# Patient Record
Sex: Male | Born: 1970 | Race: Black or African American | Hispanic: No | Marital: Single | Smoking: Never smoker
Health system: Southern US, Community
[De-identification: ages and names within clinical notes are randomized; demographics above are authoritative.]

---

## 2020-02-23 ENCOUNTER — Emergency Department (HOSPITAL_COMMUNITY): Payer: Self-pay

## 2020-02-23 ENCOUNTER — Other Ambulatory Visit: Payer: Self-pay

## 2020-02-23 ENCOUNTER — Emergency Department (HOSPITAL_COMMUNITY)
Admission: EM | Admit: 2020-02-23 | Discharge: 2020-02-23 | Disposition: A | Discharge status: Home / Self Care | Payer: Self-pay | Attending: Emergency Medicine | Admitting: Emergency Medicine

## 2020-02-23 ENCOUNTER — Encounter (HOSPITAL_COMMUNITY): Payer: Self-pay | Admitting: Emergency Medicine

## 2020-02-23 DIAGNOSIS — M79641 Pain in right hand: Secondary | ICD-10-CM | POA: Insufficient documentation

## 2020-02-23 MED ORDER — OXYCODONE-ACETAMINOPHEN 5-325 MG PO TABS
1.0000 | ORAL_TABLET | Freq: Once | ORAL | Status: AC
  Administered 2020-02-23: 1 via ORAL
  Filled 2020-02-23: qty 1

## 2020-02-23 MED ORDER — NAPROXEN 500 MG PO TABS
500.0000 mg | ORAL_TABLET | Freq: Two times a day (BID) | ORAL | 0 refills | Status: AC
Start: 2020-02-23 — End: 2020-03-01

## 2020-02-23 NOTE — ED Provider Notes (Signed)
[place] DEPT Provider Note   CSN: [id number] Arrival date & time: 02/23/20  0502     History Chief Complaint  Patient presents with  . Hand Injury    Stephen Fisher is a 49 y.o. right-hand-dominant male presents to emergency department today with chief complaint of progressively worsening right hand pain x4 days.  Patient states he was moving furniture when he lost his grip while carrying a chair him to hit his right hand on the wall.  The next day he noticed swelling and pain in his hand.  He states the pain has been constant.  The pain is an aching sensation.  Pain is located on the top of his hand and does not radiate.  He has not taken medications for symptoms prior to arrival.  He denies any fever, chills, open wound, numbness, tingling, decrease sensation in the right hand.   History reviewed. No pertinent past medical history.  There are no problems to display for this patient.   History reviewed. No pertinent surgical history.     No family history on file.  Social History   Tobacco Use  . Smoking status: Never Smoker  . Smokeless tobacco: Never Used  Substance Use Topics  . Alcohol use: Yes    Comment: occassional   . Drug use: Not Currently    Home Medications Prior to Admission medications   Medication Sig Start Date End Date Taking? Authorizing Provider  naproxen (NAPROSYN) 500 MG tablet Take 1 tablet (500 mg total) by mouth 2 (two) times daily for 7 days. 02/23/20 03/01/20  Carola Viramontes, Harley Hallmark, PA-C    Allergies    Patient has no known allergies.  Review of Systems   Review of Systems All other systems are reviewed and are negative for acute change except as noted in the HPI.  Physical Exam Updated Vital Signs BP (!) 150/103 (BP Location: Left Arm)   Pulse 68   Temp 98.1 F (36.7 C) (Oral)   Resp 17   Ht 5\' 10"  (1.778 m)   Wt 90.7 kg   SpO2 100%   BMI 28.70 kg/m   Physical Exam Vitals and nursing note  reviewed.  Constitutional:      Appearance: He is well-developed. He is not ill-appearing or toxic-appearing.  HENT:     Head: Normocephalic and atraumatic.     Nose: Nose normal.  Eyes:     General: No scleral icterus.       Right eye: No discharge.        Left eye: No discharge.     Conjunctiva/sclera: Conjunctivae normal.  Neck:     Vascular: No JVD.  Cardiovascular:     Rate and Rhythm: Normal rate and regular rhythm.     Pulses: Normal pulses.     Heart sounds: Normal heart sounds.  Pulmonary:     Effort: Pulmonary effort is normal.     Breath sounds: Normal breath sounds.  Abdominal:     General: There is no distension.  Musculoskeletal:        General: Normal range of motion.     Right hand: Swelling present. No deformity or lacerations. Normal range of motion. Normal strength. Normal sensation. Normal capillary refill.     Cervical back: Normal range of motion.     Comments: Right hand with  tenderness to palpation of third and fourth metacarpals.  + swelling. There is no joint effusion noted. Full ROM without pain.  No erythema or warmth overlaying  the joint. There is no anatomic snuff box tenderness. Normal sensation and motor function in the median, ulnar, and radial nerve distributions. 2+ radial pulse. Tactile temperature is equal in bilateral upper extremities.   Skin:    General: Skin is warm and dry.  Neurological:     Mental Status: He is oriented to person, place, and time.     GCS: GCS eye subscore is 4. GCS verbal subscore is 5. GCS motor subscore is 6.     Comments: Fluent speech, no facial droop.  Psychiatric:        Behavior: Behavior normal.     ED Results / Procedures / Treatments   Labs (all labs ordered are listed, but only abnormal results are displayed) Labs Reviewed - No data to display  EKG None  Radiology DG Hand Complete Right  Result Date: 02/23/2020 CLINICAL DATA:  Injured hand 02/20/2020. Dropped a piece of furniture on the hand.  EXAM: RIGHT HAND - COMPLETE 3+ VIEW COMPARISON:  None. FINDINGS: Moderate dorsal soft tissue swelling is noted. The joint spaces are maintained. No acute fracture is identified. IMPRESSION: 1. No acute bony findings. 2. Dorsal soft tissue swelling. Electronically Signed   By: Rudie Meyer M.D.   On: 02/23/2020 06:32    Procedures Procedures (including critical care time)  Medications Ordered in ED Medications  oxyCODONE-acetaminophen (PERCOCET/ROXICET) 5-325 MG per tablet 1 tablet (1 tablet Oral Given 02/23/20 0608)    ED Course  I have reviewed the triage vital signs and the nursing notes.  Pertinent labs & imaging results that were available during my care of the patient were reviewed by me and considered in my medical decision making (see chart for details).    MDM Rules/Calculators/A&P                      Patient presents to the ED with complaints of pain to the right hand injury x 4 days prior. Exam without obvious deformity or open wounds.  He does have swelling noted to dorsum of right hand over third and fourth metacarpals.  No anatomic snuffbox tenderness.  Compartments are soft.  ROM intact.  NVI distally. Xray negative for fracture/dislocation. Ace wrap provided for compression. Patient given dose of pain medicine in the ED discharged with prescription for naproxen for inflammation.. I discussed results, treatment plan, need for follow-up, and return precautions with the patient. Provided opportunity for questions, patient confirmed understanding and are in agreement with plan.    Portions of this note were generated with Scientist, clinical (histocompatibility and immunogenetics). Dictation errors may occur despite best attempts at proofreading.   Final Clinical Impression(s) / ED Diagnoses Final diagnoses:  Right hand pain    Rx / DC Orders ED Discharge Orders         Ordered    naproxen (NAPROSYN) 500 MG tablet  2 times daily     02/23/20 0649           Takahiro Godinho, Caroleen Hamman, PA-C 02/23/20  0715    Glynn Octave, MD 02/23/20 [phone]

## 2020-02-23 NOTE — ED Notes (Signed)
Pt stating he was at home moving furniture around and hit his hand while moving furniture.

## 2020-02-23 NOTE — Discharge Instructions (Signed)
You have been seen today for hand pain. Please read and follow all provided instructions. Return to the emergency room for worsening condition or new concerning symptoms.    Xray did not show any broken bones.  1. Medications:  Prescription sent to your pharmacy for Naproxen. This is an antiinflammatory medication. Take with food as this can cause upset stomach. Do not take any additional ibuprofen, Aleve, Motrin as these medications are all similar. -Also recommend you take Tylenol for pain.  Continue usual home medications Take medications as prescribed. Please review all of the medicines and only take them if you do not have an allergy to them.   2. Treatment: Use the Ace wrap on your hand as this will help with the swelling.  3. Follow Up:  Please follow up with primary care provider by scheduling an appointment as soon as possible for a visit  If you do not have a primary care physician, contact [facility] at [phone] for referral   It is also a possibility that you have an allergic reaction to any of the medicines that you have been prescribed - Everybody reacts differently to medications and while MOST people have no trouble with most medicines, you may have a reaction such as nausea, vomiting, rash, swelling, shortness of breath. If this is the case, please stop taking the medicine immediately and contact your physician.  ?

## 2020-02-23 NOTE — ED Triage Notes (Signed)
Patient states Monday he dropped a piece of furniture on his hand. Hand noted to be very red and swollen. Patient with feeling and movement in fingers.

## 2020-02-28 ENCOUNTER — Encounter (HOSPITAL_COMMUNITY): Payer: Self-pay

## 2020-02-28 ENCOUNTER — Emergency Department (HOSPITAL_COMMUNITY): Payer: Self-pay

## 2020-02-28 ENCOUNTER — Emergency Department (HOSPITAL_COMMUNITY)
Admission: EM | Admit: 2020-02-28 | Discharge: 2020-02-29 | Disposition: A | Discharge status: Home / Self Care | Payer: Self-pay | Attending: Emergency Medicine | Admitting: Emergency Medicine

## 2020-02-28 ENCOUNTER — Other Ambulatory Visit: Payer: Self-pay

## 2020-02-28 DIAGNOSIS — R52 Pain, unspecified: Secondary | ICD-10-CM

## 2020-02-28 DIAGNOSIS — M25512 Pain in left shoulder: Secondary | ICD-10-CM | POA: Insufficient documentation

## 2020-02-28 DIAGNOSIS — Z5321 Procedure and treatment not carried out due to patient leaving prior to being seen by health care provider: Secondary | ICD-10-CM | POA: Insufficient documentation
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
## 2020-02-29 NOTE — ED Notes (Signed)
Pt sts they were leaving waiting room. Eloped prior to speaking with nurse. No distress noted.

## 2020-05-16 ENCOUNTER — Inpatient Hospital Stay (HOSPITAL_COMMUNITY): Payer: Self-pay

## 2020-05-16 ENCOUNTER — Inpatient Hospital Stay (HOSPITAL_COMMUNITY)
Admission: EM | Admit: 2020-05-16 | Discharge: 2020-06-07 | DRG: 870 | Disposition: E | Payer: Self-pay | Attending: Pulmonary Disease | Admitting: Pulmonary Disease

## 2020-05-16 ENCOUNTER — Emergency Department (HOSPITAL_COMMUNITY): Payer: Self-pay

## 2020-05-16 DIAGNOSIS — R451 Restlessness and agitation: Secondary | ICD-10-CM | POA: Diagnosis present

## 2020-05-16 DIAGNOSIS — J1282 Pneumonia due to coronavirus disease 2019: Secondary | ICD-10-CM | POA: Diagnosis present

## 2020-05-16 DIAGNOSIS — J9690 Respiratory failure, unspecified, unspecified whether with hypoxia or hypercapnia: Secondary | ICD-10-CM

## 2020-05-16 DIAGNOSIS — E7220 Disorder of urea cycle metabolism, unspecified: Secondary | ICD-10-CM

## 2020-05-16 DIAGNOSIS — F10129 Alcohol abuse with intoxication, unspecified: Secondary | ICD-10-CM | POA: Diagnosis present

## 2020-05-16 DIAGNOSIS — E872 Acidosis, unspecified: Secondary | ICD-10-CM

## 2020-05-16 DIAGNOSIS — I472 Ventricular tachycardia: Secondary | ICD-10-CM | POA: Diagnosis present

## 2020-05-16 DIAGNOSIS — U071 COVID-19: Secondary | ICD-10-CM | POA: Diagnosis present

## 2020-05-16 DIAGNOSIS — I469 Cardiac arrest, cause unspecified: Secondary | ICD-10-CM | POA: Diagnosis present

## 2020-05-16 DIAGNOSIS — E1110 Type 2 diabetes mellitus with ketoacidosis without coma: Secondary | ICD-10-CM | POA: Diagnosis present

## 2020-05-16 DIAGNOSIS — Y906 Blood alcohol level of 120-199 mg/100 ml: Secondary | ICD-10-CM | POA: Diagnosis present

## 2020-05-16 DIAGNOSIS — R6521 Severe sepsis with septic shock: Secondary | ICD-10-CM | POA: Diagnosis present

## 2020-05-16 DIAGNOSIS — M6282 Rhabdomyolysis: Secondary | ICD-10-CM | POA: Diagnosis not present

## 2020-05-16 DIAGNOSIS — R34 Anuria and oliguria: Secondary | ICD-10-CM | POA: Diagnosis not present

## 2020-05-16 DIAGNOSIS — A4189 Other specified sepsis: Principal | ICD-10-CM | POA: Diagnosis present

## 2020-05-16 DIAGNOSIS — Z66 Do not resuscitate: Secondary | ICD-10-CM | POA: Diagnosis not present

## 2020-05-16 DIAGNOSIS — R579 Shock, unspecified: Secondary | ICD-10-CM

## 2020-05-16 DIAGNOSIS — E669 Obesity, unspecified: Secondary | ICD-10-CM | POA: Diagnosis present

## 2020-05-16 DIAGNOSIS — E875 Hyperkalemia: Secondary | ICD-10-CM | POA: Diagnosis present

## 2020-05-16 DIAGNOSIS — I10 Essential (primary) hypertension: Secondary | ICD-10-CM | POA: Diagnosis present

## 2020-05-16 DIAGNOSIS — F419 Anxiety disorder, unspecified: Secondary | ICD-10-CM | POA: Diagnosis present

## 2020-05-16 DIAGNOSIS — J9602 Acute respiratory failure with hypercapnia: Secondary | ICD-10-CM | POA: Diagnosis present

## 2020-05-16 DIAGNOSIS — J690 Pneumonitis due to inhalation of food and vomit: Secondary | ICD-10-CM | POA: Diagnosis present

## 2020-05-16 DIAGNOSIS — F10929 Alcohol use, unspecified with intoxication, unspecified: Secondary | ICD-10-CM

## 2020-05-16 DIAGNOSIS — G931 Anoxic brain damage, not elsewhere classified: Secondary | ICD-10-CM | POA: Diagnosis present

## 2020-05-16 DIAGNOSIS — Z6830 Body mass index (BMI) 30.0-30.9, adult: Secondary | ICD-10-CM

## 2020-05-16 DIAGNOSIS — N179 Acute kidney failure, unspecified: Secondary | ICD-10-CM | POA: Diagnosis present

## 2020-05-16 DIAGNOSIS — D689 Coagulation defect, unspecified: Secondary | ICD-10-CM | POA: Diagnosis not present

## 2020-05-16 DIAGNOSIS — Z978 Presence of other specified devices: Secondary | ICD-10-CM

## 2020-05-16 DIAGNOSIS — I462 Cardiac arrest due to underlying cardiac condition: Secondary | ICD-10-CM | POA: Diagnosis present

## 2020-05-16 DIAGNOSIS — Z9911 Dependence on respirator [ventilator] status: Secondary | ICD-10-CM

## 2020-05-16 DIAGNOSIS — R600 Localized edema: Secondary | ICD-10-CM | POA: Diagnosis not present

## 2020-05-16 DIAGNOSIS — K7200 Acute and subacute hepatic failure without coma: Secondary | ICD-10-CM | POA: Diagnosis present

## 2020-05-16 DIAGNOSIS — E870 Hyperosmolality and hypernatremia: Secondary | ICD-10-CM | POA: Diagnosis present

## 2020-05-16 DIAGNOSIS — I6783 Posterior reversible encephalopathy syndrome: Secondary | ICD-10-CM | POA: Diagnosis present

## 2020-05-16 DIAGNOSIS — F1410 Cocaine abuse, uncomplicated: Secondary | ICD-10-CM | POA: Diagnosis present

## 2020-05-16 DIAGNOSIS — Z515 Encounter for palliative care: Secondary | ICD-10-CM | POA: Diagnosis not present

## 2020-05-16 DIAGNOSIS — J9600 Acute respiratory failure, unspecified whether with hypoxia or hypercapnia: Secondary | ICD-10-CM

## 2020-05-16 DIAGNOSIS — Z7189 Other specified counseling: Secondary | ICD-10-CM

## 2020-05-16 DIAGNOSIS — J9601 Acute respiratory failure with hypoxia: Secondary | ICD-10-CM | POA: Diagnosis present

## 2020-05-16 DIAGNOSIS — R339 Retention of urine, unspecified: Secondary | ICD-10-CM | POA: Diagnosis present

## 2020-05-16 DIAGNOSIS — G253 Myoclonus: Secondary | ICD-10-CM | POA: Diagnosis not present

## 2020-05-16 LAB — CBG MONITORING, ED: Glucose-Capillary: 76 mg/dL (ref 70–99)

## 2020-05-16 LAB — I-STAT ARTERIAL BLOOD GAS, ED
Acid-base deficit: 18 mmol/L — ABNORMAL HIGH (ref 0.0–2.0)
Acid-base deficit: 22 mmol/L — ABNORMAL HIGH (ref 0.0–2.0)
Bicarbonate: 11.9 mmol/L — ABNORMAL LOW (ref 20.0–28.0)
Bicarbonate: 12.4 mmol/L — ABNORMAL LOW (ref 20.0–28.0)
Calcium, Ion: 0.82 mmol/L — CL (ref 1.15–1.40)
Calcium, Ion: 0.99 mmol/L — ABNORMAL LOW (ref 1.15–1.40)
HCT: 36 % — ABNORMAL LOW (ref 39.0–52.0)
HCT: 43 % (ref 39.0–52.0)
Hemoglobin: 12.2 g/dL — ABNORMAL LOW (ref 13.0–17.0)
Hemoglobin: 14.6 g/dL (ref 13.0–17.0)
O2 Saturation: 100 %
O2 Saturation: 89 %
Patient temperature: 97.3
Potassium: 5.4 mmol/L — ABNORMAL HIGH (ref 3.5–5.1)
Potassium: 7.5 mmol/L (ref 3.5–5.1)
Sodium: 141 mmol/L (ref 135–145)
Sodium: 142 mmol/L (ref 135–145)
TCO2: 13 mmol/L — ABNORMAL LOW (ref 22–32)
TCO2: 15 mmol/L — ABNORMAL LOW (ref 22–32)
pCO2 arterial: 43.1 mmHg (ref 32.0–48.0)
pCO2 arterial: 74 mmHg (ref 32.0–48.0)
pH, Arterial: 6.831 — CL (ref 7.350–7.450)
pH, Arterial: 7.046 — CL (ref 7.350–7.450)
pO2, Arterial: 420 mmHg — ABNORMAL HIGH (ref 83.0–108.0)
pO2, Arterial: 78 mmHg — ABNORMAL LOW (ref 83.0–108.0)

## 2020-05-16 LAB — COMPREHENSIVE METABOLIC PANEL
ALT: 5405 U/L — ABNORMAL HIGH (ref 0–44)
AST: 5312 U/L — ABNORMAL HIGH (ref 15–41)
Albumin: 2.7 g/dL — ABNORMAL LOW (ref 3.5–5.0)
Alkaline Phosphatase: 76 U/L (ref 38–126)
Anion gap: 25 — ABNORMAL HIGH (ref 5–15)
BUN: 14 mg/dL (ref 6–20)
CO2: 17 mmol/L — ABNORMAL LOW (ref 22–32)
Calcium: 8.9 mg/dL (ref 8.9–10.3)
Chloride: 107 mmol/L (ref 98–111)
Creatinine, Ser: 3.11 mg/dL — ABNORMAL HIGH (ref 0.61–1.24)
GFR calc Af Amer: 26 mL/min — ABNORMAL LOW (ref >60)
GFR calc non Af Amer: 23 mL/min — ABNORMAL LOW (ref >60)
Glucose, Bld: 88 mg/dL (ref 70–99)
Potassium: 5.6 mmol/L — ABNORMAL HIGH (ref 3.5–5.1)
Sodium: 149 mmol/L — ABNORMAL HIGH (ref 135–145)
Total Bilirubin: 0.9 mg/dL (ref 0.3–1.2)
Total Protein: 4.8 g/dL — ABNORMAL LOW (ref 6.5–8.1)

## 2020-05-16 LAB — D-DIMER, QUANTITATIVE: D-Dimer, Quant: >20 ug/mL-FEU — ABNORMAL HIGH (ref 0.00–0.50)

## 2020-05-16 LAB — RAPID URINE DRUG SCREEN, HOSP PERFORMED
Amphetamines: NOT DETECTED
Barbiturates: NOT DETECTED
Benzodiazepines: NOT DETECTED
Cocaine: POSITIVE — AB
Opiates: NOT DETECTED
Tetrahydrocannabinol: NOT DETECTED

## 2020-05-16 LAB — PROTIME-INR
INR: 1.9 — ABNORMAL HIGH (ref 0.8–1.2)
Prothrombin Time: 21.1 seconds — ABNORMAL HIGH (ref 11.4–15.2)

## 2020-05-16 LAB — URINALYSIS, ROUTINE W REFLEX MICROSCOPIC
Bilirubin Urine: NEGATIVE
Glucose, UA: NEGATIVE mg/dL
Ketones, ur: NEGATIVE mg/dL
Leukocytes,Ua: NEGATIVE
Nitrite: NEGATIVE
Protein, ur: 30 mg/dL — AB
Specific Gravity, Urine: 1.016 (ref 1.005–1.030)
pH: 6 (ref 5.0–8.0)

## 2020-05-16 LAB — SARS CORONAVIRUS 2 BY RT PCR (HOSPITAL ORDER, PERFORMED IN ~~LOC~~ HOSPITAL LAB): SARS Coronavirus 2: POSITIVE — AB

## 2020-05-16 LAB — I-STAT CHEM 8, ED
BUN: 21 mg/dL — ABNORMAL HIGH (ref 6–20)
Calcium, Ion: 1.03 mmol/L — ABNORMAL LOW (ref 1.15–1.40)
Chloride: 110 mmol/L (ref 98–111)
Creatinine, Ser: 3 mg/dL — ABNORMAL HIGH (ref 0.61–1.24)
Glucose, Bld: 76 mg/dL (ref 70–99)
HCT: 38 % — ABNORMAL LOW (ref 39.0–52.0)
Hemoglobin: 12.9 g/dL — ABNORMAL LOW (ref 13.0–17.0)
Potassium: 5.5 mmol/L — ABNORMAL HIGH (ref 3.5–5.1)
Sodium: 144 mmol/L (ref 135–145)
TCO2: 17 mmol/L — ABNORMAL LOW (ref 22–32)

## 2020-05-16 LAB — SALICYLATE LEVEL: Salicylate Lvl: <7 mg/dL — ABNORMAL LOW (ref 7.0–30.0)

## 2020-05-16 LAB — LACTIC ACID, PLASMA
Lactic Acid, Venous: >11 mmol/L (ref 0.5–1.9)
Lactic Acid, Venous: >11 mmol/L (ref 0.5–1.9)

## 2020-05-16 LAB — TROPONIN I (HIGH SENSITIVITY): Troponin I (High Sensitivity): 953 ng/L (ref <18)

## 2020-05-16 LAB — ACETAMINOPHEN LEVEL: Acetaminophen (Tylenol), Serum: <10 ug/mL — ABNORMAL LOW (ref 10–30)

## 2020-05-16 LAB — BRAIN NATRIURETIC PEPTIDE: B Natriuretic Peptide: 55.1 pg/mL (ref 0.0–100.0)

## 2020-05-16 LAB — AMMONIA: Ammonia: 159 umol/L — ABNORMAL HIGH (ref 9–35)

## 2020-05-16 LAB — ETHANOL: Alcohol, Ethyl (B): 123 mg/dL — ABNORMAL HIGH (ref <10)

## 2020-05-16 MED ORDER — SODIUM CHLORIDE 0.9 % IV SOLN
INTRAVENOUS | Status: AC | PRN
  Administered 2020-05-16: 1000 mL via INTRAVENOUS

## 2020-05-16 MED ORDER — HEPARIN SODIUM (PORCINE) 5000 UNIT/ML IJ SOLN
5000.0000 [IU] | Freq: Three times a day (TID) | INTRAMUSCULAR | Status: DC
  Administered 2020-05-16 – 2020-05-17 (×2): 5000 [IU] via SUBCUTANEOUS
  Filled 2020-05-16 (×2): qty 1

## 2020-05-16 MED ORDER — MIDAZOLAM HCL 2 MG/2ML IJ SOLN
2.0000 mg | INTRAMUSCULAR | Status: DC | PRN
  Administered 2020-05-17 – 2020-05-23 (×8): 2 mg via INTRAVENOUS
  Filled 2020-05-16 (×4): qty 2

## 2020-05-16 MED ORDER — SODIUM BICARBONATE 8.4 % IV SOLN
100.0000 meq | Freq: Once | INTRAVENOUS | Status: AC
  Administered 2020-05-16: 100 meq via INTRAVENOUS
  Filled 2020-05-16: qty 50

## 2020-05-16 MED ORDER — PROPOFOL 1000 MG/100ML IV EMUL
0.0000 ug/kg/min | INTRAVENOUS | Status: DC
  Filled 2020-05-16: qty 100

## 2020-05-16 MED ORDER — SODIUM CHLORIDE 0.9 % IV BOLUS
30.0000 mL/kg | Freq: Once | INTRAVENOUS | Status: AC
  Administered 2020-05-16: 2994 mL via INTRAVENOUS

## 2020-05-16 MED ORDER — NOREPINEPHRINE BITARTRATE 1 MG/ML IV SOLN
INTRAVENOUS | Status: AC | PRN
  Administered 2020-05-16: 10 ug/kg/min via INTRAVENOUS

## 2020-05-16 MED ORDER — LACTULOSE 10 GM/15ML PO SOLN
30.0000 g | Freq: Three times a day (TID) | ORAL | Status: DC
  Administered 2020-05-17 – 2020-05-26 (×30): 30 g
  Filled 2020-05-16 (×30): qty 45

## 2020-05-16 MED ORDER — SODIUM BICARBONATE-DEXTROSE 150-5 MEQ/L-% IV SOLN
150.0000 meq | INTRAVENOUS | Status: DC
  Administered 2020-05-17 (×3): 150 meq via INTRAVENOUS
  Filled 2020-05-16 (×7): qty 1000

## 2020-05-16 MED ORDER — CALCIUM GLUCONATE-NACL 1-0.675 GM/50ML-% IV SOLN
1.0000 g | Freq: Once | INTRAVENOUS | Status: AC
  Administered 2020-05-16: 1000 mg via INTRAVENOUS
  Filled 2020-05-16: qty 50

## 2020-05-16 MED ORDER — FAMOTIDINE 40 MG/5ML PO SUSR
20.0000 mg | Freq: Every day | ORAL | Status: DC
  Administered 2020-05-18: 20 mg
  Filled 2020-05-16: qty 2.5

## 2020-05-16 MED ORDER — NOREPINEPHRINE 4 MG/250ML-% IV SOLN
0.0000 ug/min | INTRAVENOUS | Status: DC
  Administered 2020-05-16: 60 ug/min via INTRAVENOUS
  Filled 2020-05-16: qty 250

## 2020-05-16 MED ORDER — ATROPINE SULFATE 1 MG/ML IJ SOLN
INTRAMUSCULAR | Status: AC | PRN
  Administered 2020-05-16: 1 mg via INTRAVENOUS

## 2020-05-16 MED ORDER — NOREPINEPHRINE 16 MG/250ML-% IV SOLN
0.0000 ug/min | INTRAVENOUS | Status: DC
  Administered 2020-05-16: 50 ug/min via INTRAVENOUS
  Administered 2020-05-17: 55 ug/min via INTRAVENOUS
  Administered 2020-05-17: 50 ug/min via INTRAVENOUS
  Administered 2020-05-17: 70 ug/min via INTRAVENOUS
  Administered 2020-05-17: 50 ug/min via INTRAVENOUS
  Administered 2020-05-17: 60 ug/min via INTRAVENOUS
  Administered 2020-05-18: 56 ug/min via INTRAVENOUS
  Administered 2020-05-18: 55 ug/min via INTRAVENOUS
  Administered 2020-05-18: 28 ug/min via INTRAVENOUS
  Administered 2020-05-19: 40 ug/min via INTRAVENOUS
  Administered 2020-05-19: 8 ug/min via INTRAVENOUS
  Filled 2020-05-16 (×12): qty 250

## 2020-05-16 MED ORDER — MIDAZOLAM 50MG/50ML (1MG/ML) PREMIX INFUSION
0.5000 mg/h | INTRAVENOUS | Status: DC
  Administered 2020-05-16: 0.5 mg/h via INTRAVENOUS
  Filled 2020-05-16: qty 50

## 2020-05-16 MED ORDER — SODIUM BICARBONATE 8.4 % IV SOLN
100.0000 meq | Freq: Once | INTRAVENOUS | Status: AC
  Administered 2020-05-16: 100 meq via INTRAVENOUS
  Filled 2020-05-16: qty 100

## 2020-05-16 MED ORDER — SODIUM ZIRCONIUM CYCLOSILICATE 10 G PO PACK
10.0000 g | PACK | Freq: Once | ORAL | Status: AC
  Administered 2020-05-16: 10 g
  Filled 2020-05-16: qty 1

## 2020-05-16 MED ORDER — SODIUM CHLORIDE 0.9 % IV SOLN: INTRAVENOUS | Status: DC | PRN

## 2020-05-16 MED ORDER — VASOPRESSIN 20 UNITS/100 ML INFUSION FOR SHOCK
0.0300 [IU]/min | INTRAVENOUS | Status: DC
  Administered 2020-05-16 – 2020-05-19 (×7): 0.03 [IU]/min via INTRAVENOUS
  Filled 2020-05-16 (×9): qty 100

## 2020-05-16 MED ORDER — MIDAZOLAM HCL 2 MG/2ML IJ SOLN
2.0000 mg | INTRAMUSCULAR | Status: AC | PRN
  Administered 2020-05-17 – 2020-05-22 (×3): 2 mg via INTRAVENOUS
  Filled 2020-05-16 (×2): qty 2

## 2020-05-16 MED ORDER — DOCUSATE SODIUM 50 MG/5ML PO LIQD: 100.0000 mg | Freq: Two times a day (BID) | ORAL | Status: DC

## 2020-05-16 MED ORDER — FENTANYL CITRATE (PF) 100 MCG/2ML IJ SOLN
50.0000 ug | INTRAMUSCULAR | Status: DC | PRN
  Administered 2020-05-20 – 2020-05-22 (×3): 50 ug via INTRAVENOUS
  Administered 2020-05-22: 100 ug via INTRAVENOUS
  Administered 2020-05-22 (×3): 50 ug via INTRAVENOUS
  Administered 2020-05-23: 100 ug via INTRAVENOUS

## 2020-05-16 MED ORDER — FENTANYL CITRATE (PF) 100 MCG/2ML IJ SOLN
50.0000 ug | INTRAMUSCULAR | Status: AC | PRN
  Administered 2020-05-17 – 2020-05-19 (×3): 50 ug via INTRAVENOUS
  Filled 2020-05-16: qty 2

## 2020-05-16 MED ORDER — FAMOTIDINE 40 MG/5ML PO SUSR: 20.0000 mg | Freq: Two times a day (BID) | ORAL | Status: DC

## 2020-05-16 MED ORDER — SODIUM BICARBONATE-DEXTROSE 150-5 MEQ/L-% IV SOLN
150.0000 meq | INTRAVENOUS | Status: DC
  Administered 2020-05-16: 150 meq via INTRAVENOUS
  Filled 2020-05-16: qty 1000

## 2020-05-16 MED ORDER — DEXTROSE 50 % IV SOLN
INTRAVENOUS | Status: AC | PRN
  Administered 2020-05-16: 1 via INTRAVENOUS

## 2020-05-16 MED ORDER — EPINEPHRINE 1 MG/10ML IJ SOSY
PREFILLED_SYRINGE | INTRAMUSCULAR | Status: AC | PRN
  Administered 2020-05-16 (×2): 1 mg via INTRAVENOUS

## 2020-05-16 MED ORDER — POLYETHYLENE GLYCOL 3350 17 G PO PACK
17.0000 g | PACK | Freq: Every day | ORAL | Status: DC
Start: 2020-05-16 — End: 2020-05-16

## 2020-05-16 MED ORDER — FENTANYL 2500MCG IN NS 250ML (10MCG/ML) PREMIX INFUSION
0.0000 ug/h | INTRAVENOUS | Status: DC
  Administered 2020-05-16: 25 ug/h via INTRAVENOUS
  Filled 2020-05-16: qty 250

## 2020-05-16 MED ORDER — METHYLPREDNISOLONE SODIUM SUCC 125 MG IJ SOLR
50.0000 mg | Freq: Two times a day (BID) | INTRAMUSCULAR | Status: DC
  Administered 2020-05-17: 50 mg via INTRAVENOUS
  Filled 2020-05-16: qty 2

## 2020-05-16 MED ORDER — INSULIN ASPART 100 UNIT/ML ~~LOC~~ SOLN
2.0000 [IU] | SUBCUTANEOUS | Status: DC
  Administered 2020-05-17: 6 [IU] via SUBCUTANEOUS

## 2020-05-16 MED ORDER — PIPERACILLIN-TAZOBACTAM 3.375 G IVPB 30 MIN
3.3750 g | Freq: Once | INTRAVENOUS | Status: AC
  Administered 2020-05-16: 3.375 g via INTRAVENOUS
  Filled 2020-05-16: qty 50

## 2020-05-16 MED ORDER — SODIUM BICARBONATE 8.4 % IV SOLN
INTRAVENOUS | Status: AC | PRN
  Administered 2020-05-16: 50 meq via INTRAVENOUS

## 2020-05-16 MED ORDER — PIPERACILLIN-TAZOBACTAM 3.375 G IVPB
3.3750 g | Freq: Three times a day (TID) | INTRAVENOUS | Status: DC
  Administered 2020-05-17: 3.375 g via INTRAVENOUS
  Filled 2020-05-16 (×2): qty 50

## 2020-05-16 MED ORDER — POLYETHYLENE GLYCOL 3350 17 G PO PACK: 17.0000 g | PACK | Freq: Every day | ORAL | Status: DC | PRN

## 2020-05-16 NOTE — ED Notes (Signed)
Provider placing central line.
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
## 2020-05-16 NOTE — ED Notes (Signed)
Per provider, bedside, gave .2 epi.

## 2020-05-16 NOTE — Code Documentation (Signed)
Intubate with 7.5 tube, 26 @ the teeth

## 2020-05-16 NOTE — ED Notes (Signed)
[phone] Stephen Fisher daughter would like an update

## 2020-05-16 NOTE — Procedures (Signed)
Arterial Catheter Insertion Procedure Note  Stephen Fisher  [id number]  Apr 23, 1971  Date:06/05/2020  Time:11:53 PM    Provider Performing: Tobey Grim    Procedure: Insertion of Arterial Line (40814) with US guidance (48185)   Indication(s) Blood pressure monitoring and/or need for frequent ABGs  Consent Unable to obtain consent due to emergent nature of procedure.  Anesthesia None   Time Out Verified patient identification, verified procedure, site/side was marked, verified correct patient position, special equipment/implants available, medications/allergies/relevant history reviewed, required imaging and test results available.   Sterile Technique Maximal sterile technique including full sterile barrier drape, hand hygiene, sterile gown, sterile gloves, mask, hair covering, sterile ultrasound probe cover (if used).   Procedure Description Area of catheter insertion was cleaned with chlorhexidine and draped in sterile fashion. With real-time ultrasound guidance an arterial catheter was placed into the left radial artery.  Appropriate arterial tracings confirmed on monitor.     Complications/Tolerance None; patient tolerated the procedure well.   EBL Minimal   Specimen(s) None

## 2020-05-16 NOTE — Progress Notes (Signed)
Pharmacy Antibiotic Note  Stephen Fisher is a 49 y.o. male admitted on 05/24/2020 with pneumonia and sepsis.  Pharmacy has been consulted for Zosyn dosing.   Height: 6' (182.9 cm) Weight: 99.8 kg (220 lb) IBW/kg (Calculated) : 77.6  Temp (24hrs), Avg:95.1 F (35.1 C), Min:94.4 F (34.7 C), Max:96 F (35.6 C)  Recent Labs  Lab 2020/05/24 1913 May 24, 2020 1915 May 24, 2020 1921  CREATININE 3.11*  --  3.00*  LATICACIDVEN  --  >11.0*  --     Estimated Creatinine Clearance: 37.2 mL/min (A) (by C-G formula based on SCr of 3 mg/dL (H)).    Not on File  Antimicrobials this admission: 8/10 Zosyn >>   Dose adjustments this admission: N/a  Microbiology results: Pending   Plan:  - Zosyn 3.375g IV x 1 dose over 30 min followed by Zosyn 3.375g IV every 8 hours infused over 4 hours  - Monitor patients renal function and urine output  - De-escalate ABX when appropriate   Thank you for allowing pharmacy to be a part of this patient's care.  Joaquim Lai PharmD. BCPS May 24, 2020 10:11 PM

## 2020-05-16 NOTE — ED Provider Notes (Signed)
[facility] EMERGENCY DEPARTMENT Provider Note  CSN: [id number] Arrival date & time: 05/14/2020 1856    History No chief complaint on file.   HPI  Stephen Fisher is a 49 y.o. male brought in by EMS in cardiac arrest. History obtained from fiancee who reports she and the patient were drinking alcohol last night when they got into an argument and he left the house around midnight. He returned to the house early this morning and they continued drinking. Around lunch time he passed out and she rolled him on his side to let him sleep and went to take a nap herself. She woke up a few hours later and noticed he was snoring and she was unable to wake him up. She states he was gasping for breath periodically and did not seem to be breathing well. EMS found him unresponsive and pulseless in asystole. CPR was initiated and he was given multiple rounds of Epi. He had one episode of v-tach requiring defibrillation with brief ROSC but then lost pulses just prior to arrival.  [name] is unaware of any medical problems or drug use.   No past medical history on file.    No family history on file.  Social History   Tobacco Use  . Smoking status: Not on file  Substance Use Topics  . Alcohol use: Not on file  . Drug use: Not on file     Home Medications Prior to Admission medications   Not on File     Allergies    Patient has no allergy information on record.   Review of Systems   Review of Systems Unable to assess due to mental status.    Physical Exam BP 132/60   Pulse 87   Temp (!) 94.5 F (34.7 C)   Resp (!) 24   Ht 6' (1.829 m)   Wt 99.8 kg   SpO2 100%   BMI 29.84 kg/m   Physical Exam Vitals and nursing note reviewed.  Constitutional:      Appearance: Normal appearance.  HENT:     Head: Normocephalic and atraumatic.     Nose: Nose normal.     Mouth/Throat:     Mouth: Mucous membranes are moist.  Eyes:     Pupils: Pupils are equal, round, and reactive to  light.  Cardiovascular:     Comments: Pulseless on arrival Pulmonary:     Comments: King airway in place on arrival with stomach contents in the tube Abdominal:     General: There is no distension.     Palpations: Abdomen is soft.  Musculoskeletal:        General: No swelling.     Cervical back: Neck supple.  Skin:    General: Skin is warm and dry.  Neurological:     Comments: unresponsive  Psychiatric:     Comments: unresponsive      ED Results / Procedures / Treatments   Labs (all labs ordered are listed, but only abnormal results are displayed) Labs Reviewed  ETHANOL - Abnormal; Notable for the following components:      Result Value   Alcohol, Ethyl (B) 123 (*)    All other components within normal limits  COMPREHENSIVE METABOLIC PANEL - Abnormal; Notable for the following components:   Sodium 149 (*)    Potassium 5.6 (*)    CO2 17 (*)    Creatinine, Ser 3.11 (*)    Total Protein 4.8 (*)    Albumin 2.7 (*)  AST 5,312 (*)    ALT 5,405 (*)    GFR calc non Af Amer 23 (*)    GFR calc Af Amer 26 (*)    Anion gap 25 (*)    All other components within normal limits  SALICYLATE LEVEL - Abnormal; Notable for the following components:   Salicylate Lvl <7.0 (*)    All other components within normal limits  ACETAMINOPHEN LEVEL - Abnormal; Notable for the following components:   Acetaminophen (Tylenol), Serum <10 (*)    All other components within normal limits  PROTIME-INR - Abnormal; Notable for the following components:   Prothrombin Time 21.1 (*)    INR 1.9 (*)    All other components within normal limits  URINALYSIS, ROUTINE W REFLEX MICROSCOPIC - Abnormal; Notable for the following components:   APPearance HAZY (*)    Hgb urine dipstick SMALL (*)    Protein, ur 30 (*)    Bacteria, UA MANY (*)    All other components within normal limits  RAPID URINE DRUG SCREEN, HOSP PERFORMED - Abnormal; Notable for the following components:   Cocaine POSITIVE (*)    All  other components within normal limits  LACTIC ACID, PLASMA - Abnormal; Notable for the following components:   Lactic Acid, Venous >11.0 (*)    All other components within normal limits  I-STAT ARTERIAL BLOOD GAS, ED - Abnormal; Notable for the following components:   pH, Arterial 6.831 (*)    pCO2 arterial 74.0 (*)    pO2, Arterial 420 (*)    Bicarbonate 12.4 (*)    TCO2 15 (*)    Acid-base deficit 22.0 (*)    Potassium 5.4 (*)    Calcium, Ion 0.99 (*)    HCT 36.0 (*)    Hemoglobin 12.2 (*)    All other components within normal limits  I-STAT CHEM 8, ED - Abnormal; Notable for the following components:   Potassium 5.5 (*)    BUN 21 (*)    Creatinine, Ser 3.00 (*)    Calcium, Ion 1.03 (*)    TCO2 17 (*)    Hemoglobin 12.9 (*)    HCT 38.0 (*)    All other components within normal limits  TROPONIN I (HIGH SENSITIVITY) - Abnormal; Notable for the following components:   Troponin I (High Sensitivity) 953 (*)    All other components within normal limits  URINE CULTURE  CULTURE, BLOOD (ROUTINE X 2)  CULTURE, BLOOD (ROUTINE X 2)  SARS CORONAVIRUS 2 BY RT PCR (HOSPITAL ORDER, PERFORMED IN [facility])  CULTURE, RESPIRATORY  BRAIN NATRIURETIC PEPTIDE  LACTIC ACID, PLASMA  MAGNESIUM  PHOSPHORUS  HEPATITIS PANEL, ACUTE  CBC  MAGNESIUM  PHOSPHORUS  COMPREHENSIVE METABOLIC PANEL  PROCALCITONIN  PROCALCITONIN  HIV ANTIBODY (ROUTINE TESTING W REFLEX)  TRIGLYCERIDES  AMMONIA  CBG MONITORING, ED  TROPONIN I (HIGH SENSITIVITY)    EKG None  Radiology DG Chest 1 View  Result Date: 05/07/2020 CLINICAL DATA:  49 year old male status post intubation. EXAM: CHEST  1 VIEW COMPARISON:  None. FINDINGS: Endotracheal tube with tip approximately 4.5 cm above the carina. Enteric tube extends below the diaphragm with tip in the proximal stomach. Bilateral upper lobe predominant streaky and nodular densities most consistent with pneumonia. Clinical correlation is recommended. No  pleural effusion or pneumothorax. Mild cardiomegaly. No acute osseous pathology. IMPRESSION: 1. Endotracheal tube above the carina. 2. Bilateral upper lobe predominant pneumonia. Clinical correlation is recommended. Electronically Signed   By: Ceasar Mons.D.  On: 05/14/2020 19:24    Procedures Procedure Name: Intubation Date/Time: 06/06/2020 8:02 PM Performed by: Pollyann SavoySheldon, Francheska Villeda B, MD Pre-anesthesia Checklist: Patient being monitored, Emergency Drugs available, Timeout performed and Suction available Oxygen Delivery Method: Ambu bag Preoxygenation: Pre-oxygenation with 100% oxygen Ventilation: Mask ventilation without difficulty Laryngoscope Size: Glidescope and 4 Grade View: Grade I Tube size: 7.5 mm Number of attempts: 1 Airway Equipment and Method: Stylet Placement Confirmation: ETT inserted through vocal cords under direct vision,  CO2 detector and Breath sounds checked- equal and bilateral Secured at: 26 cm Tube secured with: ETT holder Dental Injury: Teeth and Oropharynx as per pre-operative assessment     .Critical Care Performed by: Pollyann SavoySheldon, Siobahn Worsley B, MD Authorized by: Pollyann SavoySheldon, Amina Menchaca B, MD   Critical care provider statement:    Critical care time (minutes):  60   Critical care time was exclusive of:  Separately billable procedures and treating other patients   Critical care was necessary to treat or prevent imminent or life-threatening deterioration of the following conditions:  Shock, circulatory failure and renal failure   Critical care was time spent personally by me on the following activities:  Development of treatment plan with patient or surrogate, discussions with consultants, evaluation of patient's response to treatment, examination of patient, gastric intubation, ordering and performing treatments and interventions, ordering and review of laboratory studies, ordering and review of radiographic studies, pulse oximetry, re-evaluation of patient's condition  and review of old charts .Central Line  Date/Time: 06/03/2020 9:28 PM Performed by: Pollyann SavoySheldon, Kavonte Bearse B, MD Authorized by: Pollyann SavoySheldon, Tarri Guilfoil B, MD   Consent:    Consent obtained:  Emergent situation Universal protocol:    Patient identity confirmed:  Arm band Pre-procedure details:    Skin preparation:  2% chlorhexidine Anesthesia (see MAR for exact dosages):    Anesthesia method:  None Procedure details:    Location:  R internal jugular   Patient position:  Flat   Procedural supplies:  Triple lumen   Catheter size:  7 Fr   Landmarks identified: yes     Ultrasound guidance: yes     Sterile ultrasound techniques: Sterile gel and sterile probe covers were used     Number of attempts:  1   Successful placement: yes   Post-procedure details:    Post-procedure:  Dressing applied and line sutured   Assessment:  Blood return through all ports, free fluid flow and placement verified by x-ray   Patient tolerance of procedure:  Tolerated well, no immediate complications    Medications Ordered in the ED Medications  fentaNYL 2500mcg in NS 250mL (8610mcg/ml) infusion-PREMIX (25 mcg/hr Intravenous New Bag/Given 05/09/2020 1946)  calcium gluconate 1 g/ 50 mL sodium chloride IVPB (1,000 mg Intravenous New Bag/Given 05/30/2020 2030)  sodium zirconium cyclosilicate (LOKELMA) packet 10 g (has no administration in time range)  sodium bicarbonate 150 mEq in dextrose 5% 1000 mL infusion (has no administration in time range)  polyethylene glycol (MIRALAX / GLYCOLAX) packet 17 g (has no administration in time range)  heparin injection 5,000 Units (has no administration in time range)  norepinephrine (LEVOPHED) 4mg  in 250mL premix infusion (has no administration in time range)  propofol (DIPRIVAN) 1000 MG/100ML infusion (has no administration in time range)  famotidine (PEPCID) 40 MG/5ML suspension 20 mg (has no administration in time range)  insulin aspart (novoLOG) injection 2-6 Units (has no  administration in time range)  EPINEPHrine (ADRENALIN) 1 MG/10ML injection (1 mg Intravenous Given 05/25/2020 1900)  sodium bicarbonate injection (50 mEq Intravenous Given 05/13/2020  1902)  norepinephrine (LEVOPHED) injection (25 mcg/kg/min  99.8 kg Intravenous Rate/Dose Change 2020-06-10 2001)  0.9 %  sodium chloride infusion ( Intravenous Stopped 2020/06/10 2038)  dextrose 50 % solution (1 ampule Intravenous Given 06-10-2020 1913)  atropine injection (1 mg Intravenous Given 06/10/20 1915)  sodium bicarbonate injection 100 mEq (100 mEq Intravenous Given Jun 10, 2020 1952)  sodium chloride 0.9 % bolus 2,994 mL (2,994 mLs Intravenous New Bag/Given 06/10/20 2025)  sodium bicarbonate injection 100 mEq (100 mEq Intravenous Given 2020-06-10 2110)     MDM Rules/Calculators/A&P MDM Patient with cardiac arrest of unclear etiology. CPR continued on arrival, given additional epi, bicarb and calcium with ROSC and started on levophed. Had an eventual episode of bradycardia but never lost pulses. Given atropine with good response. King switched out for ETT. Labs and imaging ordered.  ED Course  I have reviewed the triage vital signs and the nursing notes.  Pertinent labs & imaging results that were available during my care of the patient were reviewed by me and considered in my medical decision making (see chart for details).  Clinical Course as of May 17 2311  Tue 06/10/20  1953 Marked acidosis, bicarb ordered.    [CS]  2009 INR elevated.    [CS]  2017 Lactic acid is elevated, but not febrile. Doubt sepsis, more likely from prolonged cardiac arrest. Recheck after adequate IVF resuscitation.    [CS]  2019 ASA and APAP levels neg. EtOH is elevated.    [CS]  2019 CMP shows elevated Cr (unknown baseline) and elevated K. Will give additional CaGluc, and Lokalma. Critical care paged.    [CS]  2022 Trop is elevated.    [CS]  2029 Spoke with [facility] who requests bicarb drip and they will arrange for admission.    [CS]    2041 LFTs markedly elevated, low albumin but normal bilirubin, likely acute on chronic liver disease/shock liver.    [CS]  2256 Covid positive. Fiancee informed and advised to quarantine at home.    [CS]  2307 Spoke with Bertrum Sol, adult daughter, and informed her of the patient's current status and ED workup findings. She will be the point of contact for the legal next of kin (adult children).    [CS]    Clinical Course User Index [CS] Pollyann Savoy, MD    Final Clinical Impression(s) / ED Diagnoses Final diagnoses:  Cardiac arrest (HCC)  AKI (acute kidney injury) (HCC)  Hyperkalemia  Acidosis  Cocaine abuse (HCC)  Alcoholic intoxication with complication (HCC)  COVID-19    Rx / DC Orders ED Discharge Orders    None       Pollyann Savoy, MD June 10, 2020 2313

## 2020-05-16 NOTE — ED Notes (Signed)
Critical care provider here, aware of critical bp

## 2020-05-16 NOTE — ED Triage Notes (Signed)
Pt from home with [facility], gf called out due to pt laying in bed all day "breathing strange". When ems arrived pt was unresponsive, asystole on the monitor. CBG 29. CPR done for 10 mins with 3 epis given. Pt went into v tach during this time and was shocked once. ROSC achieved for about 10 mins then pt HR dropped and pulses lost. CPR in progress upon arrival. 3 more epis given PTA.

## 2020-05-16 NOTE — ED Notes (Signed)
[phone] Stephen Fisher would like a call back

## 2020-05-16 NOTE — ED Notes (Signed)
Provider updated pt's family about 10 minutes ago.  Also aware of critical labs.

## 2020-05-16 NOTE — Code Documentation (Signed)
Pulse check: pulses palpable

## 2020-05-16 NOTE — ED Notes (Addendum)
Stephen Fisher - Son called, want's update, "they say he is bad".  [phone] - ED Provider aware

## 2020-05-16 NOTE — Code Documentation (Addendum)
Pulse check, no pulses, CPR continued

## 2020-05-16 NOTE — ED Notes (Signed)
Per pharmacy Ca given in 30 minutes Instead of an hour.

## 2020-05-17 ENCOUNTER — Inpatient Hospital Stay (HOSPITAL_COMMUNITY): Payer: Self-pay

## 2020-05-17 DIAGNOSIS — J9690 Respiratory failure, unspecified, unspecified whether with hypoxia or hypercapnia: Secondary | ICD-10-CM | POA: Insufficient documentation

## 2020-05-17 DIAGNOSIS — E872 Acidosis, unspecified: Secondary | ICD-10-CM | POA: Insufficient documentation

## 2020-05-17 DIAGNOSIS — U071 COVID-19: Secondary | ICD-10-CM | POA: Insufficient documentation

## 2020-05-17 DIAGNOSIS — R7989 Other specified abnormal findings of blood chemistry: Secondary | ICD-10-CM

## 2020-05-17 DIAGNOSIS — F1410 Cocaine abuse, uncomplicated: Secondary | ICD-10-CM | POA: Insufficient documentation

## 2020-05-17 DIAGNOSIS — I469 Cardiac arrest, cause unspecified: Secondary | ICD-10-CM

## 2020-05-17 DIAGNOSIS — N179 Acute kidney failure, unspecified: Secondary | ICD-10-CM | POA: Insufficient documentation

## 2020-05-17 LAB — I-STAT ARTERIAL BLOOD GAS, ED
Acid-base deficit: 19 mmol/L — ABNORMAL HIGH (ref 0.0–2.0)
Acid-base deficit: 20 mmol/L — ABNORMAL HIGH (ref 0.0–2.0)
Bicarbonate: 10.5 mmol/L — ABNORMAL LOW (ref 20.0–28.0)
Bicarbonate: 9.2 mmol/L — ABNORMAL LOW (ref 20.0–28.0)
Calcium, Ion: 0.8 mmol/L — CL (ref 1.15–1.40)
Calcium, Ion: 0.84 mmol/L — CL (ref 1.15–1.40)
HCT: 40 % (ref 39.0–52.0)
HCT: 48 % (ref 39.0–52.0)
Hemoglobin: 13.6 g/dL (ref 13.0–17.0)
Hemoglobin: 16.3 g/dL (ref 13.0–17.0)
O2 Saturation: 91 %
O2 Saturation: 92 %
Patient temperature: 97.4
Potassium: 5.3 mmol/L — ABNORMAL HIGH (ref 3.5–5.1)
Potassium: 4.7 mmol/L (ref 3.5–5.1)
Sodium: 145 mmol/L (ref 135–145)
Sodium: 143 mmol/L (ref 135–145)
TCO2: 12 mmol/L — ABNORMAL LOW (ref 22–32)
TCO2: 10 mmol/L — ABNORMAL LOW (ref 22–32)
pCO2 arterial: 37 mmHg (ref 32.0–48.0)
pCO2 arterial: 31.2 mmHg — ABNORMAL LOW (ref 32.0–48.0)
pH, Arterial: 7.054 — CL (ref 7.350–7.450)
pH, Arterial: 7.078 — CL (ref 7.350–7.450)
pO2, Arterial: 82 mmHg — ABNORMAL LOW (ref 83.0–108.0)
pO2, Arterial: 87 mmHg (ref 83.0–108.0)

## 2020-05-17 LAB — BASIC METABOLIC PANEL
Anion gap: 34 — ABNORMAL HIGH (ref 5–15)
Anion gap: 16 — ABNORMAL HIGH (ref 5–15)
BUN: 21 mg/dL — ABNORMAL HIGH (ref 6–20)
BUN: 38 mg/dL — ABNORMAL HIGH (ref 6–20)
CO2: 9 mmol/L — ABNORMAL LOW (ref 22–32)
CO2: 18 mmol/L — ABNORMAL LOW (ref 22–32)
Calcium: 6.4 mg/dL — CL (ref 8.9–10.3)
Calcium: 6.6 mg/dL — ABNORMAL LOW (ref 8.9–10.3)
Chloride: 104 mmol/L (ref 98–111)
Chloride: 106 mmol/L (ref 98–111)
Creatinine, Ser: 3.64 mg/dL — ABNORMAL HIGH (ref 0.61–1.24)
Creatinine, Ser: 4.99 mg/dL — ABNORMAL HIGH (ref 0.61–1.24)
GFR calc Af Amer: 22 mL/min — ABNORMAL LOW (ref >60)
GFR calc Af Amer: 15 mL/min — ABNORMAL LOW (ref >60)
GFR calc non Af Amer: 19 mL/min — ABNORMAL LOW (ref >60)
GFR calc non Af Amer: 13 mL/min — ABNORMAL LOW (ref >60)
Glucose, Bld: 383 mg/dL — ABNORMAL HIGH (ref 70–99)
Glucose, Bld: 381 mg/dL — ABNORMAL HIGH (ref 70–99)
Potassium: 5 mmol/L (ref 3.5–5.1)
Potassium: 3.5 mmol/L (ref 3.5–5.1)
Sodium: 147 mmol/L — ABNORMAL HIGH (ref 135–145)
Sodium: 140 mmol/L (ref 135–145)

## 2020-05-17 LAB — POCT I-STAT 7, (LYTES, BLD GAS, ICA,H+H)
Acid-base deficit: 15 mmol/L — ABNORMAL HIGH (ref 0.0–2.0)
Bicarbonate: 12.3 mmol/L — ABNORMAL LOW (ref 20.0–28.0)
Calcium, Ion: 0.79 mmol/L — CL (ref 1.15–1.40)
HCT: 51 % (ref 39.0–52.0)
Hemoglobin: 17.3 g/dL — ABNORMAL HIGH (ref 13.0–17.0)
O2 Saturation: 96 %
Patient temperature: 39
Potassium: 4.5 mmol/L (ref 3.5–5.1)
Sodium: 144 mmol/L (ref 135–145)
TCO2: 13 mmol/L — ABNORMAL LOW (ref 22–32)
pCO2 arterial: 35.8 mmHg (ref 32.0–48.0)
pH, Arterial: 7.154 — CL (ref 7.350–7.450)
pO2, Arterial: 117 mmHg — ABNORMAL HIGH (ref 83.0–108.0)

## 2020-05-17 LAB — URINE CULTURE: Culture: <10000 — AB

## 2020-05-17 LAB — COMPREHENSIVE METABOLIC PANEL
ALT: 8756 U/L — ABNORMAL HIGH (ref 0–44)
ALT: 9163 U/L — ABNORMAL HIGH (ref 0–44)
AST: 10943 U/L — ABNORMAL HIGH (ref 15–41)
AST: >10000 U/L — ABNORMAL HIGH (ref 15–41)
Albumin: 2.6 g/dL — ABNORMAL LOW (ref 3.5–5.0)
Albumin: 2.2 g/dL — ABNORMAL LOW (ref 3.5–5.0)
Alkaline Phosphatase: 98 U/L (ref 38–126)
Alkaline Phosphatase: 113 U/L (ref 38–126)
Anion gap: 26 — ABNORMAL HIGH (ref 5–15)
Anion gap: 29 — ABNORMAL HIGH (ref 5–15)
BUN: 14 mg/dL (ref 6–20)
BUN: 31 mg/dL — ABNORMAL HIGH (ref 6–20)
CO2: 11 mmol/L — ABNORMAL LOW (ref 22–32)
CO2: 11 mmol/L — ABNORMAL LOW (ref 22–32)
Calcium: 6.4 mg/dL — CL (ref 8.9–10.3)
Calcium: 6.1 mg/dL — CL (ref 8.9–10.3)
Chloride: 110 mmol/L (ref 98–111)
Chloride: 104 mmol/L (ref 98–111)
Creatinine, Ser: 2.49 mg/dL — ABNORMAL HIGH (ref 0.61–1.24)
Creatinine, Ser: 4.71 mg/dL — ABNORMAL HIGH (ref 0.61–1.24)
GFR calc Af Amer: 34 mL/min — ABNORMAL LOW (ref >60)
GFR calc Af Amer: 16 mL/min — ABNORMAL LOW (ref >60)
GFR calc non Af Amer: 30 mL/min — ABNORMAL LOW (ref >60)
GFR calc non Af Amer: 14 mL/min — ABNORMAL LOW (ref >60)
Glucose, Bld: 83 mg/dL (ref 70–99)
Glucose, Bld: 517 mg/dL (ref 70–99)
Potassium: 7.3 mmol/L (ref 3.5–5.1)
Potassium: 4.6 mmol/L (ref 3.5–5.1)
Sodium: 147 mmol/L — ABNORMAL HIGH (ref 135–145)
Sodium: 144 mmol/L (ref 135–145)
Total Bilirubin: 1 mg/dL (ref 0.3–1.2)
Total Bilirubin: 1.4 mg/dL — ABNORMAL HIGH (ref 0.3–1.2)
Total Protein: 4.9 g/dL — ABNORMAL LOW (ref 6.5–8.1)
Total Protein: 4.5 g/dL — ABNORMAL LOW (ref 6.5–8.1)

## 2020-05-17 LAB — PHOSPHORUS
Phosphorus: >30 mg/dL — ABNORMAL HIGH (ref 2.5–4.6)
Phosphorus: 9.8 mg/dL — ABNORMAL HIGH (ref 2.5–4.6)

## 2020-05-17 LAB — RENAL FUNCTION PANEL
Albumin: 1.9 g/dL — ABNORMAL LOW (ref 3.5–5.0)
Anion gap: 22 — ABNORMAL HIGH (ref 5–15)
BUN: 35 mg/dL — ABNORMAL HIGH (ref 6–20)
CO2: 15 mmol/L — ABNORMAL LOW (ref 22–32)
Calcium: 7.7 mg/dL — ABNORMAL LOW (ref 8.9–10.3)
Chloride: 107 mmol/L (ref 98–111)
Creatinine, Ser: 4.92 mg/dL — ABNORMAL HIGH (ref 0.61–1.24)
GFR calc Af Amer: 15 mL/min — ABNORMAL LOW (ref >60)
GFR calc non Af Amer: 13 mL/min — ABNORMAL LOW (ref >60)
Glucose, Bld: 453 mg/dL — ABNORMAL HIGH (ref 70–99)
Phosphorus: 4.9 mg/dL — ABNORMAL HIGH (ref 2.5–4.6)
Potassium: 3.8 mmol/L (ref 3.5–5.1)
Sodium: 144 mmol/L (ref 135–145)

## 2020-05-17 LAB — TROPONIN I (HIGH SENSITIVITY)
Troponin I (High Sensitivity): 9460 ng/L (ref <18)
Troponin I (High Sensitivity): 21252 ng/L (ref <18)
Troponin I (High Sensitivity): 19390 ng/L (ref <18)

## 2020-05-17 LAB — CK: Total CK: 36803 U/L — ABNORMAL HIGH (ref 49–397)

## 2020-05-17 LAB — ECHOCARDIOGRAM COMPLETE
Area-P 1/2: 3.99 cm2
Height: 72 in
S' Lateral: 2.07 cm
Weight: 3520 oz

## 2020-05-17 LAB — GLUCOSE, CAPILLARY
Glucose-Capillary: 483 mg/dL — ABNORMAL HIGH (ref 70–99)
Glucose-Capillary: 446 mg/dL — ABNORMAL HIGH (ref 70–99)
Glucose-Capillary: 408 mg/dL — ABNORMAL HIGH (ref 70–99)
Glucose-Capillary: 480 mg/dL — ABNORMAL HIGH (ref 70–99)
Glucose-Capillary: 475 mg/dL — ABNORMAL HIGH (ref 70–99)
Glucose-Capillary: 410 mg/dL — ABNORMAL HIGH (ref 70–99)
Glucose-Capillary: 410 mg/dL — ABNORMAL HIGH (ref 70–99)
Glucose-Capillary: 378 mg/dL — ABNORMAL HIGH (ref 70–99)
Glucose-Capillary: 360 mg/dL — ABNORMAL HIGH (ref 70–99)
Glucose-Capillary: 316 mg/dL — ABNORMAL HIGH (ref 70–99)
Glucose-Capillary: 279 mg/dL — ABNORMAL HIGH (ref 70–99)

## 2020-05-17 LAB — CBC
HCT: 55.3 % — ABNORMAL HIGH (ref 39.0–52.0)
Hemoglobin: 16.5 g/dL (ref 13.0–17.0)
MCH: 28 pg (ref 26.0–34.0)
MCHC: 29.8 g/dL — ABNORMAL LOW (ref 30.0–36.0)
MCV: 93.9 fL (ref 80.0–100.0)
Platelets: 258 10*3/uL (ref 150–400)
RBC: 5.89 MIL/uL — ABNORMAL HIGH (ref 4.22–5.81)
RDW: 12.8 % (ref 11.5–15.5)
WBC: 31.8 10*3/uL — ABNORMAL HIGH (ref 4.0–10.5)
nRBC: 0.3 % — ABNORMAL HIGH (ref 0.0–0.2)

## 2020-05-17 LAB — HEPATITIS PANEL, ACUTE
HCV Ab: NONREACTIVE
Hep A IgM: NONREACTIVE
Hep B C IgM: NONREACTIVE
Hepatitis B Surface Ag: NONREACTIVE

## 2020-05-17 LAB — CBG MONITORING, ED
Glucose-Capillary: 150 mg/dL — ABNORMAL HIGH (ref 70–99)
Glucose-Capillary: 267 mg/dL — ABNORMAL HIGH (ref 70–99)
Glucose-Capillary: 367 mg/dL — ABNORMAL HIGH (ref 70–99)
Glucose-Capillary: 85 mg/dL (ref 70–99)

## 2020-05-17 LAB — HIV ANTIBODY (ROUTINE TESTING W REFLEX): HIV Screen 4th Generation wRfx: NONREACTIVE

## 2020-05-17 LAB — NA AND K (SODIUM & POTASSIUM), RAND UR
Potassium Urine: 46 mmol/L
Sodium, Ur: 96 mmol/L

## 2020-05-17 LAB — HEPARIN LEVEL (UNFRACTIONATED): Heparin Unfractionated: <0.1 IU/mL — ABNORMAL LOW (ref 0.30–0.70)

## 2020-05-17 LAB — PROCALCITONIN
Procalcitonin: 3.24 ng/mL
Procalcitonin: 28.15 ng/mL

## 2020-05-17 LAB — LACTATE DEHYDROGENASE: LDH: >10000 U/L — ABNORMAL HIGH (ref 98–192)

## 2020-05-17 LAB — HEMOGLOBIN A1C
Hgb A1c MFr Bld: 5.5 % (ref 4.8–5.6)
Mean Plasma Glucose: 111.15 mg/dL

## 2020-05-17 LAB — MAGNESIUM
Magnesium: 2.8 mg/dL — ABNORMAL HIGH (ref 1.7–2.4)
Magnesium: 2.3 mg/dL (ref 1.7–2.4)

## 2020-05-17 LAB — TRIGLYCERIDES: Triglycerides: 362 mg/dL — ABNORMAL HIGH (ref <150)

## 2020-05-17 LAB — C-REACTIVE PROTEIN: CRP: 1 mg/dL — ABNORMAL HIGH (ref <1.0)

## 2020-05-17 LAB — LACTIC ACID, PLASMA: Lactic Acid, Venous: >11 mmol/L (ref 0.5–1.9)

## 2020-05-17 LAB — BETA-HYDROXYBUTYRIC ACID
Beta-Hydroxybutyric Acid: 0.37 mmol/L — ABNORMAL HIGH (ref 0.05–0.27)
Beta-Hydroxybutyric Acid: 0.21 mmol/L (ref 0.05–0.27)

## 2020-05-17 MED ORDER — ALBUTEROL SULFATE (2.5 MG/3ML) 0.083% IN NEBU
10.0000 mg | INHALATION_SOLUTION | Freq: Once | RESPIRATORY_TRACT | Status: AC
  Administered 2020-05-17: 10 mg via RESPIRATORY_TRACT
  Filled 2020-05-17: qty 12

## 2020-05-17 MED ORDER — INSULIN REGULAR(HUMAN) IN NACL 100-0.9 UT/100ML-% IV SOLN
INTRAVENOUS | Status: DC
  Administered 2020-05-17: 16 [IU]/h via INTRAVENOUS
  Administered 2020-05-18: 5.5 [IU]/h via INTRAVENOUS
  Administered 2020-05-18: 7 [IU]/h via INTRAVENOUS
  Filled 2020-05-17 (×3): qty 100

## 2020-05-17 MED ORDER — PRISMASOL BGK 4/2.5 32-4-2.5 MEQ/L IV SOLN
INTRAVENOUS | Status: DC
  Filled 2020-05-17 (×55): qty 5000

## 2020-05-17 MED ORDER — CHLORHEXIDINE GLUCONATE CLOTH 2 % EX PADS
6.0000 | MEDICATED_PAD | Freq: Every day | CUTANEOUS | Status: DC
  Administered 2020-05-17 – 2020-05-25 (×8): 6 via TOPICAL

## 2020-05-17 MED ORDER — LACTATED RINGERS IV SOLN: INTRAVENOUS | Status: DC

## 2020-05-17 MED ORDER — HYDROCORTISONE NA SUCCINATE PF 100 MG IJ SOLR
100.0000 mg | Freq: Three times a day (TID) | INTRAMUSCULAR | Status: DC
  Administered 2020-05-17 – 2020-05-20 (×10): 100 mg via INTRAVENOUS
  Filled 2020-05-17 (×11): qty 2

## 2020-05-17 MED ORDER — DEXTROSE 50 % IV SOLN
1.0000 | Freq: Once | INTRAVENOUS | Status: AC
  Administered 2020-05-17: 50 mL via INTRAVENOUS
  Filled 2020-05-17: qty 50

## 2020-05-17 MED ORDER — BARICITINIB 2 MG PO TABS
2.0000 mg | ORAL_TABLET | Freq: Every day | ORAL | Status: DC
  Administered 2020-05-17 – 2020-05-24 (×8): 2 mg via ORAL
  Filled 2020-05-17 (×8): qty 1

## 2020-05-17 MED ORDER — HEPARIN SODIUM (PORCINE) 1000 UNIT/ML DIALYSIS
1000.0000 [IU] | INTRAMUSCULAR | Status: DC | PRN
  Administered 2020-05-17 – 2020-05-20 (×2): 3200 [IU] via INTRAVENOUS_CENTRAL
  Administered 2020-05-25: 3000 [IU] via INTRAVENOUS_CENTRAL
  Administered 2020-05-25: 3200 [IU] via INTRAVENOUS_CENTRAL
  Filled 2020-05-17 (×2): qty 6
  Filled 2020-05-17: qty 4
  Filled 2020-05-17 (×4): qty 6
  Filled 2020-05-17: qty 4
  Filled 2020-05-17: qty 6

## 2020-05-17 MED ORDER — ORAL CARE MOUTH RINSE
15.0000 mL | OROMUCOSAL | Status: DC
  Administered 2020-05-17 – 2020-05-26 (×89): 15 mL via OROMUCOSAL

## 2020-05-17 MED ORDER — SODIUM CHLORIDE 0.9 % IV SOLN
2.0000 g | Freq: Once | INTRAVENOUS | Status: AC
  Administered 2020-05-17: 2 g via INTRAVENOUS
  Filled 2020-05-17: qty 20

## 2020-05-17 MED ORDER — DEXTROSE IN LACTATED RINGERS 5 % IV SOLN: INTRAVENOUS | Status: DC

## 2020-05-17 MED ORDER — PIPERACILLIN-TAZOBACTAM 3.375 G IVPB 30 MIN
3.3750 g | Freq: Four times a day (QID) | INTRAVENOUS | Status: DC
  Administered 2020-05-17 – 2020-05-20 (×12): 3.375 g via INTRAVENOUS
  Filled 2020-05-17 (×25): qty 50

## 2020-05-17 MED ORDER — INSULIN ASPART 100 UNIT/ML IV SOLN
5.0000 [IU] | Freq: Once | INTRAVENOUS | Status: AC
  Administered 2020-05-17: 5 [IU] via INTRAVENOUS

## 2020-05-17 MED ORDER — HEPARIN SODIUM (PORCINE) 1000 UNIT/ML IJ SOLN
1000.0000 [IU] | INTRAMUSCULAR | Status: DC | PRN
  Filled 2020-05-17: qty 6

## 2020-05-17 MED ORDER — PRISMASOL BGK 4/2.5 32-4-2.5 MEQ/L REPLACEMENT SOLN
Status: DC
  Filled 2020-05-17 (×11): qty 5000

## 2020-05-17 MED ORDER — PRISMASOL BGK 4/2.5 32-4-2.5 MEQ/L REPLACEMENT SOLN
Status: DC
  Filled 2020-05-17 (×18): qty 5000

## 2020-05-17 MED ORDER — EPINEPHRINE HCL 5 MG/250ML IV SOLN IN NS
0.5000 ug/min | INTRAVENOUS | Status: DC
  Administered 2020-05-17 (×2): 20 ug/min via INTRAVENOUS
  Administered 2020-05-17: 17:00:00 19 ug/min via INTRAVENOUS
  Administered 2020-05-17: 1 ug/min via INTRAVENOUS
  Filled 2020-05-17 (×4): qty 250

## 2020-05-17 MED ORDER — HEPARIN (PORCINE) 25000 UT/250ML-% IV SOLN
1900.0000 [IU]/h | INTRAVENOUS | Status: DC
  Administered 2020-05-17: 1600 [IU]/h via INTRAVENOUS
  Administered 2020-05-18 (×2): 1900 [IU]/h via INTRAVENOUS
  Filled 2020-05-17 (×3): qty 250

## 2020-05-17 MED ORDER — DEXTROSE 50 % IV SOLN: 0.0000 mL | INTRAVENOUS | Status: DC | PRN

## 2020-05-17 MED ORDER — SODIUM BICARBONATE-DEXTROSE 150-5 MEQ/L-% IV SOLN
150.0000 meq | INTRAVENOUS | Status: DC
  Administered 2020-05-17 – 2020-05-20 (×10): 150 meq via INTRAVENOUS
  Filled 2020-05-17 (×10): qty 1000

## 2020-05-17 MED ORDER — HEPARIN BOLUS VIA INFUSION
3000.0000 [IU] | Freq: Once | INTRAVENOUS | Status: AC
  Administered 2020-05-17: 3000 [IU] via INTRAVENOUS
  Filled 2020-05-17: qty 3000

## 2020-05-17 MED ORDER — FENTANYL 2500MCG IN NS 250ML (10MCG/ML) PREMIX INFUSION
0.0000 ug/h | INTRAVENOUS | Status: DC
  Administered 2020-05-17: 50 ug/h via INTRAVENOUS
  Administered 2020-05-18: 125 ug/h via INTRAVENOUS
  Administered 2020-05-19: 60 ug/h via INTRAVENOUS
  Administered 2020-05-20: 200 ug/h via INTRAVENOUS
  Administered 2020-05-21: 150 ug/h via INTRAVENOUS
  Administered 2020-05-21: 100 ug/h via INTRAVENOUS
  Administered 2020-05-22: 200 ug/h via INTRAVENOUS
  Administered 2020-05-23: 100 ug/h via INTRAVENOUS
  Administered 2020-05-23: 200 ug/h via INTRAVENOUS
  Filled 2020-05-17 (×9): qty 250

## 2020-05-17 MED ORDER — SODIUM CHLORIDE 0.9 % IV SOLN: INTRAVENOUS | Status: DC

## 2020-05-17 MED ORDER — INSULIN REGULAR(HUMAN) IN NACL 100-0.9 UT/100ML-% IV SOLN
INTRAVENOUS | Status: DC
  Administered 2020-05-17: 5 [IU]/h via INTRAVENOUS
  Filled 2020-05-17: qty 100

## 2020-05-17 MED ORDER — FOLIC ACID 5 MG/ML IJ SOLN
1.0000 mg | Freq: Every day | INTRAMUSCULAR | Status: DC
  Administered 2020-05-17 – 2020-05-18 (×2): 1 mg via INTRAVENOUS
  Filled 2020-05-17 (×2): qty 0.2

## 2020-05-17 MED ORDER — CHLORHEXIDINE GLUCONATE 0.12% ORAL RINSE (MEDLINE KIT)
15.0000 mL | Freq: Two times a day (BID) | OROMUCOSAL | Status: DC
  Administered 2020-05-17 – 2020-05-26 (×18): 15 mL via OROMUCOSAL

## 2020-05-17 MED ORDER — THIAMINE HCL 100 MG/ML IJ SOLN
100.0000 mg | Freq: Every day | INTRAMUSCULAR | Status: DC
  Administered 2020-05-17 – 2020-05-18 (×2): 100 mg via INTRAVENOUS
  Filled 2020-05-17 (×2): qty 2

## 2020-05-17 MED ORDER — SODIUM CHLORIDE 0.9 % FOR CRRT: INTRAVENOUS_CENTRAL | Status: DC | PRN

## 2020-05-17 MED ORDER — CALCIUM GLUCONATE-NACL 1-0.675 GM/50ML-% IV SOLN
1.0000 g | Freq: Once | INTRAVENOUS | Status: AC
  Administered 2020-05-17: 1000 mg via INTRAVENOUS
  Filled 2020-05-17: qty 50

## 2020-05-17 MED ORDER — SODIUM CHLORIDE 0.9 % IV SOLN: 4.0000 g | Freq: Once | INTRAVENOUS | Status: DC

## 2020-05-17 NOTE — Progress Notes (Signed)
Lower extremity venous has been completed.   Preliminary results in CV Proc.   Blanch Media 05/17/2020 2:42 PM

## 2020-05-17 NOTE — ED Notes (Signed)
Family bedside, Critical care provider updating family

## 2020-05-17 NOTE — ED Notes (Signed)
Called pharmacy for epi drip stat

## 2020-05-17 NOTE — ED Notes (Signed)
Paged Dr Dellie Catholic to [facility]

## 2020-05-17 NOTE — Consult Note (Signed)
Reason for Consult: Renal failure Referring Physician:  Dr. Isaiah Serge  Chief Complaint: Unresponsive  Assessment/Plan: 1. Renal failure - in florid failure from COVID/ sepsis and likelihood of survival is very low. But after d/w CCM will offer RRT with CRRT; will give a trial to see if there is any  improvement within a defined  time period. Rt fem cath (8/10). Options very limited and from renal standpoint only supportive but will use Oxiris filter to hopefully decrease inflammatory mediators. - No UF given on 3 pressors and will use a 4K bath, adjust accordingly.  - He will be on systemic heparin for possible DVT/ PE with elevated d-dimers.  2. COVID / septic shock - per CCM. Currently on HCO3 gtt as well and 3 pressors. 3. Cocaine positive - echo requested already with elevated CK and troponins. 4. S/p cardiac arrest  HPI: Stephen Fisher is an 49 y.o. male who fell asleep about lunch time and was noted to be unresponsive by the spouse who was sleeping beside him. After EMS responded he was noted to be in asystole and CPR was initiated. He had cardiac arrest again with VT noted which responded to cardioversion. In the ED he was noted to be in shock as well as positive for COVID19. CK was 37K and Troponin 9460 w/ a lactic acid >11 w/ WBC 31.8K, cocaine and ETOH positive. Patient was in florid shock + lactic acidosis and renal failure treated with broad spectrum abx, 3 pressors, steroids, Remdesivir, baricitinib and HCO3.  Initial pH of blood gas was 6.8.   ROS Pertinent items are noted in HPI.  Chemistry and CBC: Creatinine, Ser  Date/Time Value Ref Range Status  05/17/2020 07:25 AM 3.64 (H) 0.61 - 1.24 mg/dL Final    Comment:    DELTA CHECK NOTED  05/25/2020 08:49 PM 2.49 (H) 0.61 - 1.24 mg/dL Final  57/84/6962 95:28 PM 3.00 (H) 0.61 - 1.24 mg/dL Final  41/32/4401 02:72 PM 3.11 (H) 0.61 - 1.24 mg/dL Final   Recent Labs  Lab 06/04/2020 1913 05/18/2020 1913 05/19/2020 1921  05/15/2020 1921 05/07/2020 1937 06/01/2020 2049 05/30/2020 2341 05/17/20 0123 05/17/20 0725 05/17/20 0852 05/17/20 1355  NA 149*   < > 144   < > 141 147* 142 145 147* 143 144  K 5.6*   < > 5.5*   < > 5.4* 7.3* 7.5* 5.3* 5.0 4.7 4.5  CL 107  --  110  --   --  110  --   --  104  --   --   CO2 17*  --   --   --   --  11*  --   --  9*  --   --   GLUCOSE 88  --  76  --   --  83  --   --  383*  --   --   BUN 14  --  21*  --   --  14  --   --  21*  --   --   CREATININE 3.11*  --  3.00*  --   --  2.49*  --   --  3.64*  --   --   CALCIUM 8.9  --   --   --   --  6.4*  --   --  6.4*  --   --   PHOS  --   --   --   --   --  >30.0*  --   --  9.8*  --   --    < > = values in this interval not displayed.   Recent Labs  Lab 05/17/20 0123 05/17/20 0725 05/17/20 0852 05/17/20 1355  WBC  --  31.8*  --   --   HGB 13.6 16.5 16.3 17.3*  HCT 40.0 55.3* 48.0 51.0  MCV  --  93.9  --   --   PLT  --  258  --   --    Liver Function Tests: Recent Labs  Lab 05/13/2020 1913 05/07/2020 2049  AST 5,312* 10,943*  ALT 5,405* 8,756*  ALKPHOS 76 98  BILITOT 0.9 1.0  PROT 4.8* 4.9*  ALBUMIN 2.7* 2.6*   No results for input(s): LIPASE, AMYLASE in the last 168 hours. Recent Labs  Lab 05/31/2020 2210  AMMONIA 159*   Cardiac Enzymes: Recent Labs  Lab 05/17/20 0725  CKTOTAL 38,937*   Iron Studies: No results for input(s): IRON, TIBC, TRANSFERRIN, FERRITIN in the last 72 hours. PT/INR: @LABRCNTIP (inr:5)  Xrays/Other Studies: ) Results for orders placed or performed during the hospital encounter of 06/01/2020 (from the past 48 hour(s))  Culture, blood (routine x 2)     Status: None (Preliminary result)   Collection Time: 05/11/2020  7:11 PM   Specimen: BLOOD  Result Value Ref Range   Specimen Description BLOOD RIGHT ANTECUBITAL    Special Requests      BOTTLES DRAWN AEROBIC AND ANAEROBIC Blood Culture adequate volume   Culture      NO GROWTH < 24 HOURS Performed at [facility] Lab, [address],  [place], [place]    Report Status PENDING   CBG monitoring, ED     Status: None   Collection Time: 05/20/2020  7:12 PM  Result Value Ref Range   Glucose-Capillary 76 70 - 99 mg/dL    Comment: Glucose reference range applies only to samples taken after fasting for at least 8 hours.  Ethanol     Status: Abnormal   Collection Time: 05/27/2020  7:13 PM  Result Value Ref Range   Alcohol, Ethyl (B) 123 (H) <10 mg/dL    Comment: (NOTE) Lowest detectable limit for serum alcohol is 10 mg/dL.  For medical purposes only. Performed at [facility] Lab, [address] [place], [place]   Comprehensive metabolic panel     Status: Abnormal   Collection Time: 06/01/2020  7:13 PM  Result Value Ref Range   Sodium 149 (H) 135 - 145 mmol/L   Potassium 5.6 (H) 3.5 - 5.1 mmol/L   Chloride 107 98 - 111 mmol/L   CO2 17 (L) 22 - 32 mmol/L   Glucose, Bld 88 70 - 99 mg/dL    Comment: Glucose reference range applies only to samples taken after fasting for at least 8 hours.   BUN 14 6 - 20 mg/dL   Creatinine, Ser 07/16/20 (H) 0.61 - 1.24 mg/dL   Calcium 8.9 8.9 - 7.26 mg/dL   Total Protein 4.8 (L) 6.5 - 8.1 g/dL   Albumin 2.7 (L) 3.5 - 5.0 g/dL   AST 20.3 (H) 15 - 41 U/L    Comment: RESULTS CONFIRMED BY MANUAL DILUTION   ALT 5,405 (H) 0 - 44 U/L    Comment: RESULTS CONFIRMED BY MANUAL DILUTION   Alkaline Phosphatase 76 38 - 126 U/L   Total Bilirubin 0.9 0.3 - 1.2 mg/dL   GFR calc non Af Amer 23 (L) >60 mL/min   GFR calc Af Amer 26 (L) >  60 mL/min   Anion gap 25 (H) 5 - 15    Comment: Performed at [facility] Lab, [address], [place], [place] [address]  Brain natriuretic peptide     Status: None   Collection Time: 06-Jun-2020  7:13 PM  Result Value Ref Range   B Natriuretic Peptide 55.1 0.0 - 100.0 pg/mL    Comment: Performed at [facility] Lab, [address], [place], [place] [address]  Troponin I (High Sensitivity)     Status: Abnormal   Collection Time: 06/06/20  7:13 PM  Result  Value Ref Range   Troponin I (High Sensitivity) 953 (HH) <18 ng/L    Comment: CRITICAL RESULT CALLED TO, READ BACK BY AND VERIFIED WITH: GLOSTER J,RN 06/06/20 2018 WAYK Performed at [facility] [place] Lab, [address] [place], [place] [address]   Salicylate level     Status: Abnormal   Collection Time: 2020/06/06  7:13 PM  Result Value Ref Range   Salicylate Lvl <7.0 (L) 7.0 - 30.0 mg/dL    Comment: Performed at [facility] Lab, [address], [place], [place] [address]  Acetaminophen level     Status: Abnormal   Collection Time: 06/06/2020  7:13 PM  Result Value Ref Range   Acetaminophen (Tylenol), Serum <10 (L) 10 - 30 ug/mL    Comment: (NOTE) Therapeutic concentrations vary significantly. A range of 10-30 ug/mL  may be an effective concentration for many patients. However, some  are best treated at concentrations outside of this range. Acetaminophen concentrations >150 ug/mL at 4 hours after ingestion  and >50 ug/mL at 12 hours after ingestion are often associated with  toxic reactions.  Performed at [facility] Lab, [address], [place], [place] [address]   Protime-INR     Status: Abnormal   Collection Time: 06-Jun-2020  7:13 PM  Result Value Ref Range   Prothrombin Time 21.1 (H) 11.4 - 15.2 seconds   INR 1.9 (H) 0.8 - 1.2    Comment: (NOTE) INR goal varies based on device and disease states. Performed at [facility] Lab, [address], [place], [place] [address]   Urinalysis, Routine w reflex microscopic Urine, Catheterized     Status: Abnormal   Collection Time: June 06, 2020  7:14 PM  Result Value Ref Range   Color, Urine YELLOW YELLOW   APPearance HAZY (A) CLEAR   Specific Gravity, Urine 1.016 1.005 - 1.030   pH 6.0 5.0 - 8.0   Glucose, UA NEGATIVE NEGATIVE mg/dL   Hgb urine dipstick SMALL (A) NEGATIVE   Bilirubin Urine NEGATIVE NEGATIVE   Ketones, ur NEGATIVE NEGATIVE mg/dL   Protein, ur 30 (A) NEGATIVE mg/dL   Nitrite NEGATIVE NEGATIVE   Leukocytes,Ua  NEGATIVE NEGATIVE   RBC / HPF 6-10 0 - 5 RBC/hpf   WBC, UA 0-5 0 - 5 WBC/hpf   Bacteria, UA MANY (A) NONE SEEN   Mucus PRESENT    Sperm, UA PRESENT     Comment: Performed at [facility] Lab, [address], [place], [place] [address]  Urine rapid drug screen (hosp performed)     Status: Abnormal   Collection Time: 06-06-20  7:14 PM  Result Value Ref Range   Opiates NONE DETECTED NONE DETECTED   Cocaine POSITIVE (A) NONE DETECTED   Benzodiazepines NONE DETECTED NONE DETECTED   Amphetamines NONE DETECTED NONE DETECTED   Tetrahydrocannabinol NONE DETECTED NONE DETECTED   Barbiturates NONE DETECTED NONE DETECTED    Comment: (NOTE)  DRUG SCREEN FOR MEDICAL PURPOSES ONLY.  IF CONFIRMATION IS NEEDED FOR ANY PURPOSE, NOTIFY LAB WITHIN 5 DAYS.  LOWEST DETECTABLE LIMITS FOR URINE DRUG SCREEN Drug Class                     Cutoff (ng/mL) Amphetamine and metabolites    1000 Barbiturate and metabolites    200 Benzodiazepine                 200 Tricyclics and metabolites     300 Opiates and metabolites        300 Cocaine and metabolites        300 THC                            50 Performed at [facility] Lab, [address], [place], [place] [address]   Lactic acid, plasma     Status: Abnormal   Collection Time: 05/23/2020  7:15 PM  Result Value Ref Range   Lactic Acid, Venous >11.0 (HH) 0.5 - 1.9 mmol/L    Comment: CRITICAL RESULT CALLED TO, READ BACK BY AND VERIFIED WITH: GLOSTERJ,RN 05/07/2020 2016 WAYK Performed at [facility] Lab, [address] [place], [place] [address]   SARS Coronavirus 2 by RT PCR (hospital order, performed in [facility] hospital lab) Nasopharyngeal Nasopharyngeal Swab     Status: Abnormal   Collection Time: 05/10/2020  7:15 PM   Specimen: Nasopharyngeal Swab  Result Value Ref Range   SARS Coronavirus 2 POSITIVE (A) NEGATIVE    Comment: RESULT CALLED TO, READ BACK BY AND VERIFIED WITH: Maia Plan RN 05/22/2020 2139 JDW (NOTE) SARS-CoV-2 target nucleic  acids are DETECTED  SARS-CoV-2 RNA is generally detectable in upper respiratory specimens  during the acute phase of infection.  Positive results are indicative  of the presence of the identified virus, but do not rule out bacterial infection or co-infection with other pathogens not detected by the test.  Clinical correlation with patient history and  other diagnostic information is necessary to determine patient infection status.  The expected result is negative.  Fact Sheet for Patients:   BoilerBrush.com.cy   Fact Sheet for Healthcare Providers:   https://pope.com/    This test is not yet approved or cleared by the [place] FDA and  has been authorized for detection and/or diagnosis of SARS-CoV-2 by FDA under an Emergency Use Authorization (EUA).  This EUA will remain in effect (meaning this test ca n be used) for the duration of  the COVID-19 declaration under Section 564(b)(1) of the Act, 21 U.S.C. section 360-bbb-3(b)(1), unless the authorization is terminated or revoked sooner.  Performed at [facility] Lab, [address] [place], [place] [address]   I-stat chem 8, ED (not at [facility] or [facility])     Status: Abnormal   Collection Time: 06/06/2020  7:21 PM  Result Value Ref Range   Sodium 144 135 - 145 mmol/L   Potassium 5.5 (H) 3.5 - 5.1 mmol/L   Chloride 110 98 - 111 mmol/L   BUN 21 (H) 6 - 20 mg/dL   Creatinine, Ser 9.14 (H) 0.61 - 1.24 mg/dL   Glucose, Bld 76 70 - 99 mg/dL    Comment: Glucose reference range applies only to samples taken after fasting for at least 8 hours.   Calcium, Ion 1.03 (L) 1.15 - 1.40 mmol/L   TCO2 17 (L) 22 - 32 mmol/L  Hemoglobin 12.9 (L) 13.0 - 17.0 g/dL   HCT 16.1 (L) 39 - 52 %  I-Stat arterial blood gas, ED     Status: Abnormal   Collection Time: 06/13/20  7:37 PM  Result Value Ref Range   pH, Arterial 6.831 (LL) 7.35 - 7.45   pCO2 arterial 74.0 (HH) 32 - 48 mmHg   pO2, Arterial 420  (H) 83 - 108 mmHg   Bicarbonate 12.4 (L) 20.0 - 28.0 mmol/L   TCO2 15 (L) 22 - 32 mmol/L   O2 Saturation 100.0 %   Acid-base deficit 22.0 (H) 0.0 - 2.0 mmol/L   Sodium 141 135 - 145 mmol/L   Potassium 5.4 (H) 3.5 - 5.1 mmol/L   Calcium, Ion 0.99 (L) 1.15 - 1.40 mmol/L   HCT 36.0 (L) 39 - 52 %   Hemoglobin 12.2 (L) 13.0 - 17.0 g/dL   Collection site Radial    Drawn by RT    Sample type ARTERIAL   Magnesium     Status: Abnormal   Collection Time: 06-13-20  8:49 PM  Result Value Ref Range   Magnesium 2.8 (H) 1.7 - 2.4 mg/dL    Comment: Performed at [facility] Lab, [address] [place], [place] [address]  Phosphorus     Status: Abnormal   Collection Time: 06/13/20  8:49 PM  Result Value Ref Range   Phosphorus >30.0 (H) 2.5 - 4.6 mg/dL    Comment: RESULTS CONFIRMED BY MANUAL DILUTION Performed at [facility] Lab, [address], [place], [place] [address]   Hepatitis panel, acute     Status: None   Collection Time: 2020/06/13  8:49 PM  Result Value Ref Range   Hepatitis B Surface Ag NON REACTIVE NON REACTIVE   HCV Ab NON REACTIVE NON REACTIVE    Comment: (NOTE) Nonreactive HCV antibody screen is consistent with no HCV infections,  unless recent infection is suspected or other evidence exists to indicate HCV infection.     Hep A IgM NON REACTIVE NON REACTIVE   Hep B C IgM NON REACTIVE NON REACTIVE    Comment: Performed at [facility], [address], [place], [place] [address]  Comprehensive metabolic panel     Status: Abnormal   Collection Time: 13-Jun-2020  8:49 PM  Result Value Ref Range   Sodium 147 (H) 135 - 145 mmol/L   Potassium 7.3 (HH) 3.5 - 5.1 mmol/L    Comment: NO VISIBLE HEMOLYSIS CRITICAL RESULT CALLED TO, READ BACK BY AND VERIFIED WITH: Madalyn Rob RN [id number] M GARRETT    Chloride 110 98 - 111 mmol/L   CO2 11 (L) 22 - 32 mmol/L   Glucose, Bld 83 70 - 99 mg/dL    Comment: Glucose reference range applies only to samples taken after  fasting for at least 8 hours.   BUN 14 6 - 20 mg/dL   Creatinine, Ser 2.13 (H) 0.61 - 1.24 mg/dL   Calcium 6.4 (LL) 8.9 - 10.3 mg/dL    Comment: CRITICAL RESULT CALLED TO, READ BACK BY AND VERIFIED WITH: Madalyn Rob RN [id number] M GARRETT    Total Protein 4.9 (L) 6.5 - 8.1 g/dL   Albumin 2.6 (L) 3.5 - 5.0 g/dL   AST 46,962 (H) 15 - 41 U/L    Comment: RESULTS CONFIRMED BY MANUAL DILUTION   ALT 8,756 (H) 0 - 44 U/L    Comment: RESULTS CONFIRMED BY MANUAL DILUTION   Alkaline Phosphatase 98 38 - 126  U/L   Total Bilirubin 1.0 0.3 - 1.2 mg/dL   GFR calc non Af Amer 30 (L) >60 mL/min   GFR calc Af Amer 34 (L) >60 mL/min   Anion gap 26 (H) 5 - 15    Comment: Performed at [facility], [address], [place], [place] [address]  Procalcitonin - Baseline     Status: None   Collection Time: 17-May-2020  8:49 PM  Result Value Ref Range   Procalcitonin 3.24 ng/mL    Comment:        Interpretation: PCT > 2 ng/mL: Systemic infection (sepsis) is likely, unless other causes are known. (NOTE)       Sepsis PCT Algorithm           Lower Respiratory Tract                                      Infection PCT Algorithm    ----------------------------     ----------------------------         PCT < 0.25 ng/mL                PCT < 0.10 ng/mL          Strongly encourage             Strongly discourage   discontinuation of antibiotics    initiation of antibiotics    ----------------------------     -----------------------------       PCT 0.25 - 0.50 ng/mL            PCT 0.10 - 0.25 ng/mL               OR       >80% decrease in PCT            Discourage initiation of                                            antibiotics      Encourage discontinuation           of antibiotics    ----------------------------     -----------------------------         PCT >= 0.50 ng/mL              PCT 0.26 - 0.50 ng/mL               AND       <80% decrease in PCT              Encourage initiation of                                              antibiotics       Encourage continuation           of antibiotics    ----------------------------     -----------------------------        PCT >= 0.50 ng/mL                  PCT > 0.50 ng/mL               AND         increase in  PCT                  Strongly encourage                                      initiation of antibiotics    Strongly encourage escalation           of antibiotics                                     -----------------------------                                           PCT <= 0.25 ng/mL                                                 OR                                        > 80% decrease in PCT                                      Discontinue / Do not initiate                                             antibiotics  Performed at [facility], [address] [place], [place] [address]   HIV Antibody (routine testing w rflx)     Status: None   Collection Time: 05/15/2020  8:49 PM  Result Value Ref Range   HIV Screen 4th Generation wRfx Non Reactive Non Reactive    Comment: Performed at [facility], [address] [place], [place] [address]  Lactic acid, plasma     Status: Abnormal   Collection Time: 05/24/2020 10:09 PM  Result Value Ref Range   Lactic Acid, Venous >11.0 (HH) 0.5 - 1.9 mmol/L    Comment: CRITICAL VALUE NOTED.  VALUE IS CONSISTENT WITH PREVIOUSLY REPORTED AND CALLED VALUE. Performed at [facility], [address] [place], [place] [address]   D-dimer, quantitative (not at [facility])     Status: Abnormal   Collection Time: 05/09/2020 10:09 PM  Result Value Ref Range   D-Dimer, Quant >20.00 (H) 0.00 - 0.50 ug/mL-FEU    Comment: (NOTE) At the manufacturer cut-off of 0.50 ug/mL FEU, this assay has been documented to exclude PE with a sensitivity and negative predictive value of 97 to 99%.  At this time, this assay has not been approved by the FDA to exclude DVT/VTE. Results should be correlated  with clinical presentation. Performed at [facility], [address] [place], [place] [address]   Lactate dehydrogenase     Status: Abnormal   Collection Time: 05/27/2020 10:09 PM  Result Value Ref Range  LDH >10,000 (H) 98 - 192 U/L    Comment: RESULTS CONFIRMED BY MANUAL DILUTION Performed at [facility] Lab, [address], [place], [place] [address]   Ammonia     Status: Abnormal   Collection Time: 2020/06/13 10:10 PM  Result Value Ref Range   Ammonia 159 (H) 9 - 35 umol/L    Comment: Performed at [facility], [address] [place], [place] [address]  I-Stat arterial blood gas, ED     Status: Abnormal   Collection Time: 13-Jun-2020 11:41 PM  Result Value Ref Range   pH, Arterial 7.046 (LL) 7.35 - 7.45   pCO2 arterial 43.1 32 - 48 mmHg   pO2, Arterial 78 (L) 83 - 108 mmHg   Bicarbonate 11.9 (L) 20.0 - 28.0 mmol/L   TCO2 13 (L) 22 - 32 mmol/L   O2 Saturation 89.0 %   Acid-base deficit 18.0 (H) 0.0 - 2.0 mmol/L   Sodium 142 135 - 145 mmol/L   Potassium 7.5 (HH) 3.5 - 5.1 mmol/L   Calcium, Ion 0.82 (LL) 1.15 - 1.40 mmol/L   HCT 43.0 39 - 52 %   Hemoglobin 14.6 13.0 - 17.0 g/dL   Patient temperature 67.8 F    Collection site art line    Drawn by RT    Sample type ARTERIAL   CBG monitoring, ED     Status: None   Collection Time: 05/17/20 12:34 AM  Result Value Ref Range   Glucose-Capillary 85 70 - 99 mg/dL    Comment: Glucose reference range applies only to samples taken after fasting for at least 8 hours.  I-Stat arterial blood gas, ED     Status: Abnormal   Collection Time: 05/17/20  1:23 AM  Result Value Ref Range   pH, Arterial 7.054 (LL) 7.35 - 7.45   pCO2 arterial 37.0 32 - 48 mmHg   pO2, Arterial 82 (L) 83 - 108 mmHg   Bicarbonate 10.5 (L) 20.0 - 28.0 mmol/L   TCO2 12 (L) 22 - 32 mmol/L   O2 Saturation 91.0 %   Acid-base deficit 19.0 (H) 0.0 - 2.0 mmol/L   Sodium 145 135 - 145 mmol/L   Potassium 5.3 (H) 3.5 - 5.1 mmol/L   Calcium, Ion 0.80 (LL)  1.15 - 1.40 mmol/L   HCT 40.0 39 - 52 %   Hemoglobin 13.6 13.0 - 17.0 g/dL   Patient temperature 93.8 F    Collection site Web designer by RT    Sample type ARTERIAL   Na and K (sodium & potassium), rand urine     Status: None   Collection Time: 05/17/20  1:57 AM  Result Value Ref Range   Sodium, Ur 96 mmol/L   Potassium Urine 46 mmol/L    Comment: Performed at [facility] Lab, [address], [place], [place] [address]  CBG monitoring, ED     Status: Abnormal   Collection Time: 05/17/20  2:26 AM  Result Value Ref Range   Glucose-Capillary 150 (H) 70 - 99 mg/dL    Comment: Glucose reference range applies only to samples taken after fasting for at least 8 hours.  CBG monitoring, ED     Status: Abnormal   Collection Time: 05/17/20  4:13 AM  Result Value Ref Range   Glucose-Capillary 267 (H) 70 - 99 mg/dL    Comment: Glucose reference range applies only to samples taken after fasting for at least 8 hours.  Troponin I (High  Sensitivity)     Status: Abnormal   Collection Time: 05/17/20  7:25 AM  Result Value Ref Range   Troponin I (High Sensitivity) 9,460 (HH) <18 ng/L    Comment: CRITICAL VALUE NOTED.  VALUE IS CONSISTENT WITH PREVIOUSLY REPORTED AND CALLED VALUE. (NOTE) Elevated high sensitivity troponin I (hsTnI) values and significant  changes across serial measurements may suggest ACS but many other  chronic and acute conditions are known to elevate hsTnI results.  Refer to the Links section for chest pain algorithms and additional  guidance. Performed at [facility] Lab, [address], [place], [place] [address]   CBC     Status: Abnormal   Collection Time: 05/17/20  7:25 AM  Result Value Ref Range   WBC 31.8 (H) 4.0 - 10.5 K/uL   RBC 5.89 (H) 4.22 - 5.81 MIL/uL   Hemoglobin 16.5 13.0 - 17.0 g/dL   HCT 60.4 (H) 39 - 52 %   MCV 93.9 80.0 - 100.0 fL   MCH 28.0 26.0 - 34.0 pg   MCHC 29.8 (L) 30.0 - 36.0 g/dL   RDW 54.0 98.1 - 19.1 %   Platelets 258 150 - 400  K/uL   nRBC 0.3 (H) 0.0 - 0.2 %    Comment: Performed at [facility] Lab, [address] [place], [place] [address]  Magnesium     Status: None   Collection Time: 05/17/20  7:25 AM  Result Value Ref Range   Magnesium 2.3 1.7 - 2.4 mg/dL    Comment: Performed at [facility] Lab, [address], [place], [place] [address]  Phosphorus     Status: Abnormal   Collection Time: 05/17/20  7:25 AM  Result Value Ref Range   Phosphorus 9.8 (H) 2.5 - 4.6 mg/dL    Comment: Performed at [facility] Lab, [address], [place], [place] [address]  Procalcitonin     Status: None   Collection Time: 05/17/20  7:25 AM  Result Value Ref Range   Procalcitonin 28.15 ng/mL    Comment:        Interpretation: PCT >= 10 ng/mL: Important systemic inflammatory response, almost exclusively due to severe bacterial sepsis or septic shock. (NOTE)       Sepsis PCT Algorithm           Lower Respiratory Tract                                      Infection PCT Algorithm    ----------------------------     ----------------------------         PCT < 0.25 ng/mL                PCT < 0.10 ng/mL          Strongly encourage             Strongly discourage   discontinuation of antibiotics    initiation of antibiotics    ----------------------------     -----------------------------       PCT 0.25 - 0.50 ng/mL            PCT 0.10 - 0.25 ng/mL               OR       >80% decrease in PCT            Discourage initiation of  antibiotics      Encourage discontinuation           of antibiotics    ----------------------------     -----------------------------         PCT >= 0.50 ng/mL              PCT 0.26 - 0.50 ng/mL                AND       <80% decrease in PCT             Encourage initiation of                                             antibiotics       Encourage continuation           of antibiotics    ----------------------------      -----------------------------        PCT >= 0.50 ng/mL                  PCT > 0.50 ng/mL               AND         increase in PCT                  Strongly encourage                                      initiation of antibiotics    Strongly encourage escalation           of antibiotics                                     -----------------------------                                           PCT <= 0.25 ng/mL                                                 OR                                        > 80% decrease in PCT                                      Discontinue / Do not initiate                                             antibiotics  Performed at [facility], [address] [place], [place] [address]   Triglycerides     Status: Abnormal   Collection Time:  05/17/20  7:25 AM  Result Value Ref Range   Triglycerides 362 (H) <150 mg/dL    Comment: Performed at [facility], [address] [place], [place] [address]  C-reactive protein     Status: Abnormal   Collection Time: 05/17/20  7:25 AM  Result Value Ref Range   CRP 1.0 (H) <1.0 mg/dL    Comment: Performed at [facility], [address], [place], [place] [address]  CK     Status: Abnormal   Collection Time: 05/17/20  7:25 AM  Result Value Ref Range   Total CK 36,803 (H) 49.0 - 397.0 U/L    Comment: RESULTS CONFIRMED BY MANUAL DILUTION Performed at [facility], [address], [place], [place] [address]   Basic metabolic panel     Status: Abnormal   Collection Time: 05/17/20  7:25 AM  Result Value Ref Range   Sodium 147 (H) 135 - 145 mmol/L   Potassium 5.0 3.5 - 5.1 mmol/L   Chloride 104 98 - 111 mmol/L   CO2 9 (L) 22 - 32 mmol/L   Glucose, Bld 383 (H) 70 - 99 mg/dL    Comment: Glucose reference range applies only to samples taken after fasting for at least 8 hours.   BUN 21 (H) 6 - 20 mg/dL   Creatinine, Ser 9.14 (H) 0.61 - 1.24 mg/dL    Comment: DELTA CHECK NOTED   Calcium 6.4 (LL)  8.9 - 10.3 mg/dL    Comment: CRITICAL RESULT CALLED TO, READ BACK BY AND VERIFIED WITH: MICHAELSON,R RN @0908  ON [id number] BY FLEMINGS    GFR calc non Af Amer 19 (L) >60 mL/min   GFR calc Af Amer 22 (L) >60 mL/min   Anion gap 34 (H) 5 - 15    Comment: Performed at [facility], [address] [place], [place] [address]  CBG monitoring, ED     Status: Abnormal   Collection Time: 05/17/20  8:47 AM  Result Value Ref Range   Glucose-Capillary 367 (H) 70 - 99 mg/dL    Comment: Glucose reference range applies only to samples taken after fasting for at least 8 hours.  I-Stat arterial blood gas, ED     Status: Abnormal   Collection Time: 05/17/20  8:52 AM  Result Value Ref Range   pH, Arterial 7.078 (LL) 7.35 - 7.45   pCO2 arterial 31.2 (L) 32 - 48 mmHg   pO2, Arterial 87 83 - 108 mmHg   Bicarbonate 9.2 (L) 20.0 - 28.0 mmol/L   TCO2 10 (L) 22 - 32 mmol/L   O2 Saturation 92.0 %   Acid-base deficit 20.0 (H) 0.0 - 2.0 mmol/L   Sodium 143 135 - 145 mmol/L   Potassium 4.7 3.5 - 5.1 mmol/L   Calcium, Ion 0.84 (LL) 1.15 - 1.40 mmol/L   HCT 48.0 39 - 52 %   Hemoglobin 16.3 13.0 - 17.0 g/dL   Collection site Radial    Drawn by MD    Sample type ARTERIAL   Glucose, capillary     Status: Abnormal   Collection Time: 05/17/20 11:36 AM  Result Value Ref Range   Glucose-Capillary 483 (H) 70 - 99 mg/dL    Comment: Glucose reference range applies only to samples taken after fasting for at least 8 hours.  I-STAT 7, (LYTES, BLD GAS, ICA, H+H)     Status: Abnormal   Collection Time: 05/17/20  1:55 PM  Result Value Ref Range  pH, Arterial 7.154 (LL) 7.35 - 7.45   pCO2 arterial 35.8 32 - 48 mmHg   pO2, Arterial 117 (H) 83 - 108 mmHg   Bicarbonate 12.3 (L) 20.0 - 28.0 mmol/L   TCO2 13 (L) 22 - 32 mmol/L   O2 Saturation 96.0 %   Acid-base deficit 15.0 (H) 0.0 - 2.0 mmol/L   Sodium 144 135 - 145 mmol/L   Potassium 4.5 3.5 - 5.1 mmol/L   Calcium, Ion 0.79 (LL) 1.15 - 1.40 mmol/L   HCT 51.0 39 -  52 %   Hemoglobin 17.3 (H) 13.0 - 17.0 g/dL   Patient temperature 44.3 C    Sample type ARTERIAL    DG Chest 1 View  Result Date: 06/03/2020 CLINICAL DATA:  49 year old male status post intubation. EXAM: CHEST  1 VIEW COMPARISON:  None. FINDINGS: Endotracheal tube with tip approximately 4.5 cm above the carina. Enteric tube extends below the diaphragm with tip in the proximal stomach. Bilateral upper lobe predominant streaky and nodular densities most consistent with pneumonia. Clinical correlation is recommended. No pleural effusion or pneumothorax. Mild cardiomegaly. No acute osseous pathology. IMPRESSION: 1. Endotracheal tube above the carina. 2. Bilateral upper lobe predominant pneumonia. Clinical correlation is recommended. Electronically Signed   By: Elgie Collard M.D.   On: 05/21/2020 19:24   CT Head Wo Contrast  Result Date: 05/17/2020 CLINICAL DATA:  Mental status change and weakness. EXAM: CT HEAD WITHOUT CONTRAST TECHNIQUE: Contiguous axial images were obtained from the base of the skull through the vertex without intravenous contrast. COMPARISON:  None. 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 Recommend MRI brain for further evaluation. The ventricles are in the midline without mass effect or shift. No extra-axial fluid collections are identified. No CT findings for hemispheric infarction or intracranial hemorrhage. Vascular: No hyperdense vessels or aneurysm. Skull: No skull fracture or bone lesions. Sinuses/Orbits: Pansinusitis. The mastoid air cells and middle ear cavities are clear. The globes are intact. Other: No scalp lesions or scalp hematoma. IMPRESSION: 1. 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 Recommend MRI brain for further evaluation. 2. No findings for  infarction, hemorrhage or mass. 3. Pansinusitis. Electronically Signed   By: Rudie Meyer M.D.   On: 05/17/2020 05:27   DG Chest Portable 1 View  Result Date: 05/24/2020 CLINICAL DATA:  49 year old male with central line placement. EXAM: PORTABLE CHEST 1 VIEW COMPARISON:  Earlier radiograph dated 06/04/2020. FINDINGS: Interval placement of a right IJ central venous line with tip over central SVC. No pneumothorax. Endotracheal tube remains above the carina and enteric tube extends below the diaphragm. Interval worsening of bilateral pulmonary densities, right greater left. No large pleural effusion. Stable cardiac silhouette. No acute osseous pathology. IMPRESSION: 1. Interval placement of a right IJ central venous line with tip over central SVC. No pneumothorax. 2. Interval worsening of the bilateral pulmonary densities. Electronically Signed   By: Elgie Collard M.D.   On: 05/28/2020 21:37    PMH:  No past medical history on file.  PSH:  unknown  Allergies: Not on File  Medications:   Prior to Admission medications   Not on File    Discontinued Meds:   Medications Discontinued During This Encounter  Medication Reason  . midazolam (VERSED) 50 mg/50 mL (1 mg/mL) premix infusion   . docusate (COLACE) 50 MG/5ML liquid 100 mg   . polyethylene glycol (MIRALAX /  GLYCOLAX) packet 17 g   . famotidine (PEPCID) 40 MG/5ML suspension 20 mg   . norepinephrine (LEVOPHED)  in premix infusion   . fentaNYL in NS (48mcg/ml) infusion-PREMIX   . propofol (DIPRIVAN) 1000 MG/100ML infusion   . sodium bicarbonate 150 mEq in dextrose 5% 1000 mL infusion   . methylPREDNISolone sodium succinate (SOLU-MEDROL) 125 mg/2 mL injection 50 mg   . heparin injection 5,000 Units   . insulin aspart (novoLOG) injection 2-6 Units   . piperacillin-tazobactam (ZOSYN) IVPB 3.375 g   . insulin regular, human (MYXREDLIN) 100 units/ 100 mL infusion   . 0.9 %  sodium chloride infusion   . dextrose 50  % solution 0-50 mL   . heparin sodium (porcine) injection 1,000-6,000 Units     Social History:  has no history on file for tobacco use, alcohol use, and drug use.  Family History:  No family history on file.  Blood pressure (!) 112/95, pulse (!) 121, temperature (!) 102.2 F (39 C), resp. rate (!) 0, height 6' (1.829 m), weight 99.8 kg, SpO2 90 %. Pertinent items are noted in HPI.       Ethelene Hal, MD 05/17/2020, 2:17 PM

## 2020-05-17 NOTE — Progress Notes (Signed)
Echo attempted.  Patient being transported shortly and will be done in 86M per conversation w/ nurse

## 2020-05-17 NOTE — Progress Notes (Signed)
ANTICOAGULATION CONSULT NOTE - Follow Up Consult  Pharmacy Consult for heparin Indication: r/o VTE  Not on File  Patient Measurements: Height: 6' (182.9 cm) Weight: 99.8 kg (220 lb) IBW/kg (Calculated) : 77.6 Heparin Dosing Weight: 97kg  Vital Signs: Temp: 100 F (37.8 C) (08/11 1800) Temp Source: Bladder (08/11 1200) BP: 141/79 (08/11 2122) Pulse Rate: 108 (08/11 1730)  Labs: Recent Labs    06/06/2020 1913 05/15/2020 1921 05/17/20 0725 05/17/20 0725 05/17/20 0852 05/17/20 1355 05/17/20 1422 05/17/20 1742 05/17/20 2153  HGB  --    < > 16.5   < > 16.3 17.3*  --   --   --   HCT  --    < > 55.3*  --  48.0 51.0  --   --   --   PLT  --   --  258  --   --   --   --   --   --   LABPROT 21.1*  --   --   --   --   --   --   --   --   INR 1.9*  --   --   --   --   --   --   --   --   HEPARINUNFRC  --   --   --   --   --   --   --   --  <0.10*  CREATININE 3.11*   < > 3.64*   < >  --   --  4.71* 4.92* 4.99*  CKTOTAL  --   --  35,573*  --   --   --   --   --   --   TROPONINIHS 953*   < > 9,460*  --   --   --  21,252* 19,390*  --    < > = values in this interval not displayed.    Estimated Creatinine Clearance: 22.4 mL/min (A) (by C-G formula based on SCr of 4.99 mg/dL (H)).  Medications:  Infusions:  .  prismasol BGK 4/2.5 500 mL/hr at 05/17/20 1656  .  prismasol BGK 4/2.5 300 mL/hr at 05/17/20 1656  . sodium chloride    . dextrose 5% lactated ringers    . epinephrine 5 mcg/min (05/17/20 2200)  . heparin 1,600 Units/hr (05/17/20 2200)  . insulin 16 Units/hr (05/17/20 2138)  . lactated ringers 125 mL/hr at 05/17/20 2200  . norepinephrine (LEVOPHED) Adult infusion 60 mcg/min (05/17/20 2200)  . piperacillin-tazobactam 100 mL/hr at 05/17/20 2200  . prismasol BGK 4/2.5 1,500 mL/hr at 05/17/20 1656  . sodium bicarbonate 150 mEq in dextrose 5% 1000 mL 150 mL/hr at 05/17/20 2217  . vasopressin 0.03 Units/min (05/17/20 2221)    Assessment: 49 yom presented to the ED s/p cardiac  arrest. Found to be COVID positive. Started on empiric heparin IV for possible VTE.   Initial HL is undetectable on 1600 units/hr. Of note, SCr continues to worsen   Goal of Therapy:  Heparin level 0.3-0.7 units/ml Monitor platelets by anticoagulation protocol: Yes   Plan:  Heparin bolus 3000 units IV x 1 and increase Heparin gtt to 1900 units/hr Check a 6 hr heparin level Daily heparin level and CBC F/u imaging for r/o VTE  Vinnie Level, PharmD., BCPS, BCCCP Clinical Pharmacist Please refer to [facility] for unit-specific pharmacist

## 2020-05-17 NOTE — Progress Notes (Signed)
ANTICOAGULATION CONSULT NOTE - Initial Consult  Pharmacy Consult for heparin Indication: r/o VTE  Not on File  Patient Measurements: Height: 6' (182.9 cm) Weight: 99.8 kg (220 lb) IBW/kg (Calculated) : 77.6 Heparin Dosing Weight: 97kg  Vital Signs: Temp: 99 F (37.2 C) (08/11 0639) BP: 91/80 (08/11 0639) Pulse Rate: 102 (08/11 0639)  Labs: Recent Labs    05/22/2020 1913 06/02/2020 1921 06/05/2020 1937 05/19/2020 2049 05/09/2020 2341 06/02/2020 2341 05/17/20 0123 05/17/20 0725  HGB  --  12.9*   < >  --  14.6   < > 13.6 16.5  HCT  --  38.0*   < >  --  43.0  --  40.0 55.3*  PLT  --   --   --   --   --   --   --  258  LABPROT 21.1*  --   --   --   --   --   --   --   INR 1.9*  --   --   --   --   --   --   --   CREATININE 3.11* 3.00*  --  2.49*  --   --   --   --   TROPONINIHS 953*  --   --   --   --   --   --   --    < > = values in this interval not displayed.    Estimated Creatinine Clearance: 44.9 mL/min (A) (by C-G formula based on SCr of 2.49 mg/dL (H)).  Medications:  Infusions:  . sodium chloride    . epinephrine 20 mcg/min (05/17/20 0201)  . heparin    . norepinephrine (LEVOPHED) Adult infusion 50 mcg/min (05/17/20 0538)  . piperacillin-tazobactam (ZOSYN)  IV 3.375 g (05/17/20 9323)  . sodium bicarbonate 150 mEq in dextrose 5% 1000 mL 150 mEq (05/17/20 0535)  . vasopressin 0.03 Units/min (05/17/20 0713)    Assessment: 49 yom presented to the ED s/p cardiac arrest. Found to be COVID positive. Starting empiric heparin IV for possible VTE. Baseline Hgb and platelets are WNL. Pt was initially started on SQ heparin prophylaxis last night with his most recent dose this morning. Of note, baseline INR is elevated at 1.9, likely related to liver dysfunction.   Goal of Therapy:  Heparin level 0.3-0.7 units/ml Monitor platelets by anticoagulation protocol: Yes   Plan:  Heparin bolus 3000 units IV x 1 - smaller bolus d/t recent administration of SQ heparin Heparin gtt 1600  units/hr Check a 6 hr heparin level Daily heparin level and CBC F/u imaging for r/o VTE  Fleta Borgeson, Drake Leach 05/17/2020,8:42 AM

## 2020-05-17 NOTE — Progress Notes (Signed)
eLink Physician-Brief Progress Note Patient Name: Stephen Fisher DOB: [birth year] MRN: [id number]   Date of Service  05/17/2020  HPI/Events of Note  Agitation   eICU Interventions  Plan: 1. Fentanyl IV IV infusion 0-200 mcg/hour. Titrate to RASS = 0 to -1.      Intervention Category Major Interventions: Delirium, psychosis, severe agitation - evaluation and management  Lenell Antu 05/17/2020, 10:37 PM

## 2020-05-17 NOTE — ED Notes (Signed)
Please call pt daughter Edgel Degnan with an update [phone]

## 2020-05-17 NOTE — Progress Notes (Addendum)
NAME:  Markeis Allman, MRN:  [id number], DOB:  09/25/1971, LOS: 1 ADMISSION DATE:  06/06/2020, CONSULTATION DATE:  06/04/2020 REFERRING MD:  Dr. Renae Gloss, CHIEF COMPLAINT:  Cardiac Arrest    History of present illness   49 year old male presents to ED s/p Cardiac Arrest. Per [name] patient was drinking last night when they got into an argument and he left the house around midnight. He returned to the house this AM and they continued drinking, he then passed out around lunch time and she rolled him on his side and left to go take a nap. Hours later when she came back he was snoring and she was unable to wake him up. She called EMS. When EMS arrived patient was pulseless in asystole. ROSC after estimated 10 minutes, one episode of V-Tach requiring Defib. ROSC was achieved briefly before loss of pulse again. On arrival patient undergoing CPR. Per EMS was given 3 EPI in transit. ROSC achieved briefly after arrival.  ABG 6.831/74/420/15. AST/ALT 5312/5405. Troponin 953. Given Calcium G., EPI, Bicarb in ED. Started on Levophed gtt. UDS +Cocaine. ETOH Level 123.   Past Medical History  Polysubstance Abuse, ETOH Abuse   Significant Hospital Events   8/10 > Presents to ED   Consults:  PCCM  Procedures:  ETT 8/10 >>  Significant Diagnostic Tests:  CT Head 8/10 > ? PRES CXR 8/10 > ? Upper lobe PNA, presumed 2/2 COVID  Micro Data:  Blood 8/10 >> U/A 8/10 >> Sputum 8/10 >> COVID 8/10 > POS  Antimicrobials:  Zosyn 8/10 >>   Interim history/subjective:  Refractory shock despite 3 pressors. Refractory acidosis despite bicarb at 150.  Objective   Blood pressure 91/80, pulse (!) 102, temperature 99 F (37.2 C), resp. rate (!) 27, height 6' (1.829 m), weight 99.8 kg, SpO2 91 %.    Vent Mode: PRVC FiO2 (%):  [100 %] 100 % Set Rate:  [18 bmp-28 bmp] 28 bmp Vt Set:  [268 mL] 620 mL PEEP:  [5 cmH20-8 cmH20] 8 cmH20 Plateau Pressure:  [15 cmH20-23 cmH20] 23 cmH20   Intake/Output  Summary (Last 24 hours) at 05/17/2020 3419 Last data filed at 05/17/2020 6222 Gross per 24 hour  Intake 80.16 ml  Output 750 ml  Net -669.84 ml   Filed Weights   05/14/2020 1906  Weight: 99.8 kg    Examination (performed from doorway with assistance of RN and RT): General: Adult male on vent, critically ill HENT: ETT/OG in place  Lungs: Coarse bilaterally Cardiovascular: RRR, no MRG Abdomen: Soft, non-distended, active bowel sounds  Extremities: -edema  Neuro: No sedation, does not follow any commands GU: foley in place    Assessment & Plan:   Asystolic Cardiac Arrest, Presumed Secondary to Polysubstance Abuse (UDS positive for cocaine) and ETOH. Unknown total downtime. - ECHO pending. - Supportive care.  Refractory shock - presumed 2/2 above. - Continue Levophed, Vasopressin. Titrate for MAP goal >65. - Continue bicarb gtt. - Add stress dose steroids.  Acute hypoxic and hypercapnic respiratory failure - presumed primarily due to COVID PNA. Concern for Aspiration given unresponsive state with unknown downtime. Elevated d-dimer - ? Unclear significance in setting COVID - Continue full vent support with LTVV - Remdesivir contraindicated due to Elevated LFT. - Stress steroids. - STAT ABG now. - Empiric heparin gtt for now. - Follow CXR.  Refractory acidosis with AKI - presumed 2/2 above. - Discussed with nephrology who agrees currently too unstable for CRRT.  Once pt transferred upstairs to  ICU, we can re-evaluate things and consider CRRT if he stabilizes some (hopefully so as this is likely his only shot of improvement). - Continue bicarb gtt. - BMP q12hrs.  Elevated LFT in setting of hypoperfusion.  APAP neg. - Trend LFT. - Acute Hep Panel Pending.   ? PRES. - Supportive care. - Too unstable for MRI.  Hx EtOH and cocaine abuse. - Thiamine / Folate. - Substance abuse counseling when able.  Best practice:  Diet: NPO Pain/Anxiety/Delirium protocol (if  indicated):  Fentanyl PRN / Midazolam PRN.  RASS goal -1. VAP protocol (if indicated):  In place. DVT prophylaxis: Heparin  GI prophylaxis: Pepcid  Glucose control: SSI  Mobility: Bedrest Code Status: FC Family Communication: Called and spoke to daughter Judi Saa and son Lyndon Code (no answer when tried to reach daughter Lillia Abed).  Explained multiorgan failure and grim prognosis.  Did explain possibility of CRRT only if we are able to stabilize him some first.  They are on their way to the hospital now to see pt before he transfers upstairs to the ICU. Disposition: ICU as soon as family visits with pt.   CC time: 70 min.   Rutherford Guys, [place] [facility] 05/17/2020, 8:54 AM

## 2020-05-17 NOTE — Progress Notes (Signed)
eLink Physician-Brief Progress Note Patient Name: Stephen Fisher DOB: July 10, 1971 MRN: [id number]   Date of Service  05/17/2020  HPI/Events of Note  Hypotension - BP = 80/50.  eICU Interventions  Plan: 1. Increase ceiling on Norepinephrine IV infusion to 70 mcg/min. 2. ABG STAT. 3. Will notify ground team of his status as well.      Intervention Category Major Interventions: Hypotension - evaluation and management  Jenilyn Magana Eugene 05/17/2020, 1:10 AM

## 2020-05-17 NOTE — Procedures (Signed)
Arterial Catheter Insertion Procedure Note  Stephen Fisher  [id number]  1971-07-13  Date:05/17/20  Time:2:06 PM    Provider Performing: Dolan Amen    Procedure: Insertion of Arterial Line (88828) with US guidance (00349)   Indication(s) Blood pressure monitoring and/or need for frequent ABGs  Consent Unable to obtain consent due to emergent nature of procedure.  Anesthesia None   Time Out Verified patient identification, verified procedure, site/side was marked, verified correct patient position, special equipment/implants available, medications/allergies/relevant history reviewed, required imaging and test results available.   Sterile Technique Maximal sterile technique including full sterile barrier drape, hand hygiene, sterile gown, sterile gloves, mask, hair covering, sterile ultrasound probe cover (if used).   Procedure Description Area of catheter insertion was cleaned with chlorhexidine and draped in sterile fashion. With real-time ultrasound guidance an arterial catheter was placed into the right femoral artery.  Appropriate arterial tracings confirmed on monitor.     Complications/Tolerance None; patient tolerated the procedure well.   EBL Minimal   Specimen(s) None

## 2020-05-17 NOTE — Progress Notes (Signed)
CRITICAL VALUE ALERT  Critical Value:  Glucose 517, Calcium 6.1, Trop 21252  Date & Time Notied:  05/17/20 1600  Provider Notified: Dr. Isaiah Serge  Orders Received/Actions taken: See orders.  Erick Blinks, RN

## 2020-05-17 NOTE — Procedures (Signed)
Central Venous Catheter Insertion Procedure Note  Stephen Fisher  [id number]  08-15-1971  Date:05/17/20  Time:2:01 PM   Provider Performing:Sedona Wenk Sande Brothers   Procedure: Insertion of Non-tunneled Central Venous Catheter(36556) with US guidance (76720)   Indication(s) Hemodialysis  Consent Unable to obtain consent due to emergent nature of procedure.  Anesthesia Topical only with 1% lidocaine   Timeout Verified patient identification, verified procedure, site/side was marked, verified correct patient position, special equipment/implants available, medications/allergies/relevant history reviewed, required imaging and test results available.  Sterile Technique Maximal sterile technique including full sterile barrier drape, hand hygiene, sterile gown, sterile gloves, mask, hair covering, sterile ultrasound probe cover (if used).  Procedure Description Area of catheter insertion was cleaned with chlorhexidine and draped in sterile fashion.  With real-time ultrasound guidance a central venous catheter was placed into the right femoral vein. Nonpulsatile blood flow and easy flushing noted in all ports. The catheter was sutured in place and sterile dressing applied.  Complications/Tolerance None; patient tolerated the procedure well. Chest X-ray is ordered to verify placement for internal jugular or subclavian cannulation.  Chest x-ray is not ordered for femoral cannulation.  EBL Minimal  Specimen(s) None

## 2020-05-18 ENCOUNTER — Inpatient Hospital Stay (HOSPITAL_COMMUNITY): Payer: Self-pay

## 2020-05-18 LAB — GLUCOSE, CAPILLARY
Glucose-Capillary: 138 mg/dL — ABNORMAL HIGH (ref 70–99)
Glucose-Capillary: 151 mg/dL — ABNORMAL HIGH (ref 70–99)
Glucose-Capillary: 156 mg/dL — ABNORMAL HIGH (ref 70–99)
Glucose-Capillary: 156 mg/dL — ABNORMAL HIGH (ref 70–99)
Glucose-Capillary: 160 mg/dL — ABNORMAL HIGH (ref 70–99)
Glucose-Capillary: 164 mg/dL — ABNORMAL HIGH (ref 70–99)
Glucose-Capillary: 170 mg/dL — ABNORMAL HIGH (ref 70–99)
Glucose-Capillary: 171 mg/dL — ABNORMAL HIGH (ref 70–99)
Glucose-Capillary: 173 mg/dL — ABNORMAL HIGH (ref 70–99)
Glucose-Capillary: 174 mg/dL — ABNORMAL HIGH (ref 70–99)
Glucose-Capillary: 175 mg/dL — ABNORMAL HIGH (ref 70–99)
Glucose-Capillary: 177 mg/dL — ABNORMAL HIGH (ref 70–99)
Glucose-Capillary: 178 mg/dL — ABNORMAL HIGH (ref 70–99)
Glucose-Capillary: 179 mg/dL — ABNORMAL HIGH (ref 70–99)
Glucose-Capillary: 180 mg/dL — ABNORMAL HIGH (ref 70–99)
Glucose-Capillary: 182 mg/dL — ABNORMAL HIGH (ref 70–99)
Glucose-Capillary: 183 mg/dL — ABNORMAL HIGH (ref 70–99)
Glucose-Capillary: 185 mg/dL — ABNORMAL HIGH (ref 70–99)
Glucose-Capillary: 186 mg/dL — ABNORMAL HIGH (ref 70–99)
Glucose-Capillary: 187 mg/dL — ABNORMAL HIGH (ref 70–99)
Glucose-Capillary: 188 mg/dL — ABNORMAL HIGH (ref 70–99)
Glucose-Capillary: 190 mg/dL — ABNORMAL HIGH (ref 70–99)
Glucose-Capillary: 214 mg/dL — ABNORMAL HIGH (ref 70–99)
Glucose-Capillary: 257 mg/dL — ABNORMAL HIGH (ref 70–99)

## 2020-05-18 LAB — BASIC METABOLIC PANEL
Anion gap: 15 (ref 5–15)
Anion gap: 13 (ref 5–15)
Anion gap: 12 (ref 5–15)
BUN: 30 mg/dL — ABNORMAL HIGH (ref 6–20)
BUN: 28 mg/dL — ABNORMAL HIGH (ref 6–20)
BUN: 26 mg/dL — ABNORMAL HIGH (ref 6–20)
CO2: 20 mmol/L — ABNORMAL LOW (ref 22–32)
CO2: 22 mmol/L (ref 22–32)
CO2: 22 mmol/L (ref 22–32)
Calcium: 6.9 mg/dL — ABNORMAL LOW (ref 8.9–10.3)
Calcium: 6.7 mg/dL — ABNORMAL LOW (ref 8.9–10.3)
Calcium: 6.8 mg/dL — ABNORMAL LOW (ref 8.9–10.3)
Chloride: 108 mmol/L (ref 98–111)
Chloride: 106 mmol/L (ref 98–111)
Chloride: 105 mmol/L (ref 98–111)
Creatinine, Ser: 4.39 mg/dL — ABNORMAL HIGH (ref 0.61–1.24)
Creatinine, Ser: 4.25 mg/dL — ABNORMAL HIGH (ref 0.61–1.24)
Creatinine, Ser: 3.95 mg/dL — ABNORMAL HIGH (ref 0.61–1.24)
GFR calc Af Amer: 17 mL/min — ABNORMAL LOW (ref >60)
GFR calc Af Amer: 18 mL/min — ABNORMAL LOW (ref >60)
GFR calc Af Amer: 20 mL/min — ABNORMAL LOW (ref >60)
GFR calc non Af Amer: 15 mL/min — ABNORMAL LOW (ref >60)
GFR calc non Af Amer: 16 mL/min — ABNORMAL LOW (ref >60)
GFR calc non Af Amer: 17 mL/min — ABNORMAL LOW (ref >60)
Glucose, Bld: 214 mg/dL — ABNORMAL HIGH (ref 70–99)
Glucose, Bld: 176 mg/dL — ABNORMAL HIGH (ref 70–99)
Glucose, Bld: 189 mg/dL — ABNORMAL HIGH (ref 70–99)
Potassium: 3.7 mmol/L (ref 3.5–5.1)
Potassium: 3.9 mmol/L (ref 3.5–5.1)
Potassium: 3.9 mmol/L (ref 3.5–5.1)
Sodium: 143 mmol/L (ref 135–145)
Sodium: 141 mmol/L (ref 135–145)
Sodium: 139 mmol/L (ref 135–145)

## 2020-05-18 LAB — POCT I-STAT 7, (LYTES, BLD GAS, ICA,H+H)
Acid-Base Excess: 2 mmol/L (ref 0.0–2.0)
Bicarbonate: 24.5 mmol/L (ref 20.0–28.0)
Calcium, Ion: 0.91 mmol/L — ABNORMAL LOW (ref 1.15–1.40)
HCT: 47 % (ref 39.0–52.0)
Hemoglobin: 16 g/dL (ref 13.0–17.0)
O2 Saturation: 99 %
Patient temperature: 37.3
Potassium: 3.8 mmol/L (ref 3.5–5.1)
Sodium: 142 mmol/L (ref 135–145)
TCO2: 25 mmol/L (ref 22–32)
pCO2 arterial: 33.3 mmHg (ref 32.0–48.0)
pH, Arterial: 7.476 — ABNORMAL HIGH (ref 7.350–7.450)
pO2, Arterial: 114 mmHg — ABNORMAL HIGH (ref 83.0–108.0)

## 2020-05-18 LAB — RENAL FUNCTION PANEL
Albumin: 2.2 g/dL — ABNORMAL LOW (ref 3.5–5.0)
Albumin: 2.2 g/dL — ABNORMAL LOW (ref 3.5–5.0)
Anion gap: 17 — ABNORMAL HIGH (ref 5–15)
Anion gap: 13 (ref 5–15)
BUN: 29 mg/dL — ABNORMAL HIGH (ref 6–20)
BUN: 24 mg/dL — ABNORMAL HIGH (ref 6–20)
CO2: 20 mmol/L — ABNORMAL LOW (ref 22–32)
CO2: 22 mmol/L (ref 22–32)
Calcium: 6.9 mg/dL — ABNORMAL LOW (ref 8.9–10.3)
Calcium: 6.9 mg/dL — ABNORMAL LOW (ref 8.9–10.3)
Chloride: 107 mmol/L (ref 98–111)
Chloride: 104 mmol/L (ref 98–111)
Creatinine, Ser: 4.32 mg/dL — ABNORMAL HIGH (ref 0.61–1.24)
Creatinine, Ser: 4.06 mg/dL — ABNORMAL HIGH (ref 0.61–1.24)
GFR calc Af Amer: 18 mL/min — ABNORMAL LOW (ref >60)
GFR calc Af Amer: 19 mL/min — ABNORMAL LOW (ref >60)
GFR calc non Af Amer: 15 mL/min — ABNORMAL LOW (ref >60)
GFR calc non Af Amer: 16 mL/min — ABNORMAL LOW (ref >60)
Glucose, Bld: 159 mg/dL — ABNORMAL HIGH (ref 70–99)
Glucose, Bld: 186 mg/dL — ABNORMAL HIGH (ref 70–99)
Phosphorus: 4.7 mg/dL — ABNORMAL HIGH (ref 2.5–4.6)
Phosphorus: 2.6 mg/dL (ref 2.5–4.6)
Potassium: 4.1 mmol/L (ref 3.5–5.1)
Potassium: 4 mmol/L (ref 3.5–5.1)
Sodium: 144 mmol/L (ref 135–145)
Sodium: 139 mmol/L (ref 135–145)

## 2020-05-18 LAB — MRSA PCR SCREENING: MRSA by PCR: NEGATIVE

## 2020-05-18 LAB — HEPATIC FUNCTION PANEL
ALT: 7807 U/L — ABNORMAL HIGH (ref 0–44)
AST: >10000 U/L — ABNORMAL HIGH (ref 15–41)
Albumin: 2.2 g/dL — ABNORMAL LOW (ref 3.5–5.0)
Alkaline Phosphatase: 128 U/L — ABNORMAL HIGH (ref 38–126)
Bilirubin, Direct: 0.6 mg/dL — ABNORMAL HIGH (ref 0.0–0.2)
Indirect Bilirubin: 1.1 mg/dL — ABNORMAL HIGH (ref 0.3–0.9)
Total Bilirubin: 1.7 mg/dL — ABNORMAL HIGH (ref 0.3–1.2)
Total Protein: 4.7 g/dL — ABNORMAL LOW (ref 6.5–8.1)

## 2020-05-18 LAB — MAGNESIUM: Magnesium: 1.9 mg/dL (ref 1.7–2.4)

## 2020-05-18 LAB — CBC
HCT: 50.7 % (ref 39.0–52.0)
Hemoglobin: 17.2 g/dL — ABNORMAL HIGH (ref 13.0–17.0)
MCH: 27.8 pg (ref 26.0–34.0)
MCHC: 33.9 g/dL (ref 30.0–36.0)
MCV: 81.9 fL (ref 80.0–100.0)
Platelets: 192 10*3/uL (ref 150–400)
RBC: 6.19 MIL/uL — ABNORMAL HIGH (ref 4.22–5.81)
RDW: 13 % (ref 11.5–15.5)
WBC: 20.9 10*3/uL — ABNORMAL HIGH (ref 4.0–10.5)
nRBC: 0.9 % — ABNORMAL HIGH (ref 0.0–0.2)

## 2020-05-18 LAB — PROCALCITONIN: Procalcitonin: 40.63 ng/mL

## 2020-05-18 LAB — BETA-HYDROXYBUTYRIC ACID: Beta-Hydroxybutyric Acid: 0.13 mmol/L (ref 0.05–0.27)

## 2020-05-18 LAB — TRIGLYCERIDES: Triglycerides: 181 mg/dL — ABNORMAL HIGH (ref <150)

## 2020-05-18 LAB — LACTIC ACID, PLASMA: Lactic Acid, Venous: 5.6 mmol/L (ref 0.5–1.9)

## 2020-05-18 LAB — PHOSPHORUS: Phosphorus: 4.8 mg/dL — ABNORMAL HIGH (ref 2.5–4.6)

## 2020-05-18 LAB — HEPARIN LEVEL (UNFRACTIONATED): Heparin Unfractionated: 0.39 IU/mL (ref 0.30–0.70)

## 2020-05-18 MED ORDER — SODIUM CHLORIDE 0.9% FLUSH
10.0000 mL | Freq: Two times a day (BID) | INTRAVENOUS | Status: DC
  Administered 2020-05-18 – 2020-05-25 (×14): 10 mL
  Administered 2020-05-26: 20 mL

## 2020-05-18 MED ORDER — FOLIC ACID 1 MG PO TABS
1.0000 mg | ORAL_TABLET | Freq: Every day | ORAL | Status: DC
  Administered 2020-05-19 – 2020-05-26 (×8): 1 mg
  Filled 2020-05-18 (×7): qty 1

## 2020-05-18 MED ORDER — HEPARIN BOLUS VIA INFUSION (CRRT)
1000.0000 [IU] | INTRAVENOUS | Status: DC | PRN
  Administered 2020-05-19 (×2): 1000 [IU] via INTRAVENOUS_CENTRAL
  Filled 2020-05-18: qty 1000

## 2020-05-18 MED ORDER — ENOXAPARIN SODIUM 30 MG/0.3ML ~~LOC~~ SOLN
30.0000 mg | SUBCUTANEOUS | Status: DC
  Administered 2020-05-18: 30 mg via SUBCUTANEOUS
  Filled 2020-05-18: qty 0.3

## 2020-05-18 MED ORDER — PANTOPRAZOLE SODIUM 40 MG PO PACK
40.0000 mg | PACK | Freq: Every day | ORAL | Status: DC
  Administered 2020-05-19 – 2020-05-20 (×2): 40 mg
  Filled 2020-05-18 (×2): qty 20

## 2020-05-18 MED ORDER — SODIUM CHLORIDE 0.9% FLUSH: 10.0000 mL | INTRAVENOUS | Status: DC | PRN

## 2020-05-18 MED ORDER — FOLIC ACID 1 MG PO TABS: 1.0000 mg | ORAL_TABLET | Freq: Every day | ORAL | Status: DC

## 2020-05-18 MED ORDER — VITAMIN B-12 100 MCG PO TABS
100.0000 ug | ORAL_TABLET | Freq: Every day | ORAL | Status: DC
  Administered 2020-05-18 – 2020-05-26 (×9): 100 ug
  Filled 2020-05-18 (×9): qty 1

## 2020-05-18 MED ORDER — THIAMINE HCL 100 MG PO TABS
100.0000 mg | ORAL_TABLET | Freq: Every day | ORAL | Status: DC
  Administered 2020-05-19 – 2020-05-26 (×8): 100 mg
  Filled 2020-05-18 (×8): qty 1

## 2020-05-18 MED ORDER — DOCUSATE SODIUM 50 MG/5ML PO LIQD
100.0000 mg | Freq: Two times a day (BID) | ORAL | Status: DC
  Administered 2020-05-18 – 2020-05-26 (×13): 100 mg
  Filled 2020-05-18 (×14): qty 10

## 2020-05-18 MED ORDER — SODIUM CHLORIDE 0.9 % IV SOLN
250.0000 [IU]/h | INTRAVENOUS | Status: DC
  Administered 2020-05-18: 250 [IU]/h via INTRAVENOUS_CENTRAL
  Administered 2020-05-19: 1700 [IU]/h via INTRAVENOUS_CENTRAL
  Administered 2020-05-19: 900 [IU]/h via INTRAVENOUS_CENTRAL
  Administered 2020-05-19 – 2020-05-20 (×2): 1700 [IU]/h via INTRAVENOUS_CENTRAL
  Administered 2020-05-20: 1500 [IU]/h via INTRAVENOUS_CENTRAL
  Filled 2020-05-18 (×7): qty 2

## 2020-05-18 MED ORDER — HEPARIN (PORCINE) 2000 UNITS/L FOR CRRT: INTRAVENOUS_CENTRAL | Status: DC | PRN

## 2020-05-18 NOTE — Progress Notes (Signed)
CRITICAL VALUE ALERT  Critical Value:  Lactic   Date & Time Notied:  8/12 05:59  Provider Notified: E-link RN  Orders Received/Actions taken: RN will make MD aware.

## 2020-05-18 NOTE — Progress Notes (Signed)
Notified MD that RN could not get an accurate O2 sat reading. A blood gas was ordered and MD notified of results.  MRI called and said they only have 4 IV pump channels for the MRI machine. The patient needs 5 IV medications to run. MD notified and said we will wait to get MRI. RN will continue to monitor patient at this time.

## 2020-05-18 NOTE — Progress Notes (Signed)
Initial Nutrition Assessment  DOCUMENTATION CODES:   Not applicable  INTERVENTION:   Once pt stabilized recommend  Initiate tube feeding via OG tube: Pivot 1.5 at 25 ml/h increasing by 10 ml every 8 hours to goal rate of 45 ml/h (1080 ml per day) Prosource TF 90 ml QID  Provides 1940 kcal, 189 gm protein, 819 ml free water daily   NUTRITION DIAGNOSIS:   Increased nutrient needs related to catabolic illness (COVID+) as evidenced by estimated needs.  GOAL:   Patient will meet greater than or equal to 90% of their needs  MONITOR:   Vent status, I & O's  REASON FOR ASSESSMENT:   Ventilator    ASSESSMENT:   Pt with PMH of ETOH and cocaine abuse admitted after cardiac arrest, persistent shock, lactic acidosis with multiorgan failure who is COVID+.    Nephrology following for renal failure from COVID/sepsis started on CRRT.   Patient is currently intubated on ventilator support MV: 17 L/min Temp (24hrs), Avg:98.9 F (37.2 C), Min:97.2 F (36.2 C), Max:101.7 F (38.7 C) MAP: 65  Medications reviewed and include: folic acid, solu-cortef, lactulose, thiamine   D5LR @ 125 ml/hr inuslin gtt Levophed @ 56 mcg Nabicarb @ 150 Vasopressin @ 9  Labs reviewed: AST: >10,000, ALT: 7,807 CBG's: 173-186   OG tube: 500 ml  UOP: 435 ml   Diet Order:   Diet Order            Diet NPO time specified  Diet effective now                 EDUCATION NEEDS:   No education needs have been identified at this time  Skin:  Skin Assessment: Reviewed RN Assessment  Last BM:  unknown  Height:   Ht Readings from Last 1 Encounters:  06/03/2020 6' (1.829 m)    Weight:   Wt Readings from Last 1 Encounters:  05/18/20 102.3 kg    Ideal Body Weight:  80.9 kg  BMI:  Body mass index is 30.59 kg/m.  Estimated Nutritional Needs:   Kcal:  1900  Protein:  180-225 grams  Fluid:  2 L/day  Cammy Copa., RD, LDN, CNSC See AMiON for contact information

## 2020-05-18 NOTE — Progress Notes (Signed)
NAME:  Stephen Fisher, MRN:  [id number], DOB:  01/04/1971, LOS: 2 ADMISSION DATE:  05/27/2020, CONSULTATION DATE:  05/24/2020 REFERRING MD:  Dr. Renae Gloss, CHIEF COMPLAINT:  Cardiac Arrest    History of present illness   49 year old male presents to ED s/p Cardiac Arrest. Per [name] patient was drinking last night when they got into an argument and he left the house around midnight. He returned to the house this AM and they continued drinking, he then passed out around lunch time and she rolled him on his side and left to go take a nap. Hours later when she came back he was snoring and she was unable to wake him up. She called EMS. When EMS arrived patient was pulseless in asystole. ROSC after estimated 10 minutes, one episode of V-Tach requiring Defib. ROSC was achieved briefly before loss of pulse again. On arrival patient undergoing CPR. Per EMS was given 3 EPI in transit. ROSC achieved briefly after arrival.  ABG 6.831/74/420/15. AST/ALT 5312/5405. Troponin 953. Given Calcium G., EPI, Bicarb in ED. Started on Levophed gtt. UDS +Cocaine. ETOH Level 123.   Past Medical History  Polysubstance Abuse, ETOH Abuse   Significant Hospital Events   8/10 > Presents to ED   Consults:  PCCM  Procedures:  ETT 8/10 >> 8/11>> Femoral CVC 8/11>> Femoral a-line   Significant Diagnostic Tests:  CT Head 8/10 > ? PRES CXR 8/10 > ? Upper lobe PNA, presumed 2/2 COVID 8/11 Echo> LVEF 65-70%, moderate LV hypertrophy, RV function normal   Micro Data:  Blood 8/10 >> U/A 8/10 >> Sputum 8/10 >> COVID 8/10 > POS  Antimicrobials:  Zosyn 8/10 >>   Interim history/subjective:  Able to wean Epi ON, on Vaso and Levo.  Refractory acidosis despite bicarb at 150.  Objective   Blood pressure (!) 101/55, pulse 99, temperature (!) 97.3 F (36.3 C), temperature source Bladder, resp. rate (!) 28, height 6' (1.829 m), weight 102.3 kg, SpO2 95 %.    Vent Mode: PRVC FiO2 (%):  [60 %-100 %] 60 % Set Rate:   [28 bmp] 28 bmp Vt Set:  [620 mL] 620 mL PEEP:  [10 cmH20-14 cmH20] 10 cmH20 Plateau Pressure:  [20 cmH20-23 cmH20] 23 cmH20   Intake/Output Summary (Last 24 hours) at 05/18/2020 0759 Last data filed at 05/18/2020 0700 Gross per 24 hour  Intake 10769.39 ml  Output 3548 ml  Net 7221.39 ml   Filed Weights   05/15/2020 1906 05/18/20 0500  Weight: 99.8 kg 102.3 kg   Physical Exam Constitutional:      Comments: Intubated  Eyes:     Comments: R sided deviation, no corneal reflex, doll's eye reflex. Pupils sluggish and pinpoint.  Cardiovascular:     Rate and Rhythm: Normal rate and regular rhythm.     Pulses: Normal pulses.     Heart sounds: No murmur heard.  No friction rub. No gallop.   Pulmonary:     Breath sounds: No wheezing.     Comments: Currently intubated, not breathing over the vent. Abdominal:     General: Abdomen is flat. Bowel sounds are normal.     Palpations: Abdomen is soft.  Musculoskeletal:     Comments: RUE swollen on appearance, unchanged from yesterday.  Skin:    General: Skin is warm.     Findings: No bruising or lesion.      Assessment & Plan:   Septic shock with Multiorgan Failure Asystolic Cardiac Arrest Likely 2/2 to Polysubstance Usage:  Troponin initially found to be 900>9.4k>21k, now downtrend to 19k. Echo back with LVEF 65-70% with normal RV/RA function, liver enzymes mildly improving yesterday for >10k AST/ALT to >10/9K, LA >11 improved to 5.6 this AM. Patient in acute renal failure (Cr: 4.3), oliguric with 400 mL UOP despite 10.9L I. Leukocytosis down to 20.9 from 31 yesterday. MRSA -. No doll's eye or corneal reflex today, CT yesterday showed no acute stroke but concern for PRES. Will obtain MRI today.  - Continue pressor support with levophed, vasopressin, epi. Titrate for MAP goal >65. - Supportive care. - Continue Bicarb drip - Continue Zosyn.  - Stress Steroids.  Acute hypoxic and hypercapnic respiratory failure In Setting of COVID  19 Mechanically Ventilated  Refractory Acidosis: Concern for Aspiration given unresponsive state with unknown downtime. Elevated d-dimer, DVT study negative, no acute abnormalities on ECHO. Normal RV function. Continue to  Receive CRRT today.  - CRRT - Continue Full Vent support - Bicarb Drip. - Continue full vent support with LTVV. - Remdesivir contraindicated due to Elevated LFT. - Continue Olumiant 2 mg QD. - Stress steroids Q8H. - Discontinue Heparin drip.  - Follow CXR.  Hyperglycemia with Elevated Anion Gap:  AG likely multifactorial likely 2/2 to LA and sugars initially in the 510s. LA down trending to 5.6, ON sugars have corrected to 170s, AG closed x1, but opened to 17 this AM, and Beta hydroxy improved to 0.13 from 0.37.  - Continue Insulin Drip  - BMP Q4H  - Transition off endotool once gap closed x2.     Best practice:  Diet: NPO Pain/Anxiety/Delirium protocol (if indicated):  Fentanyl PRN / Midazolam PRN.  RASS goal -1. VAP protocol (if indicated):  In place. DVT prophylaxis: Heparin  GI prophylaxis: Pepcid  Glucose control: SSI  Mobility: Bedrest Code Status: FC Family Communication: Will continue to update.  Disposition: ICU as soon as family visits with pt.   Dolan Amen, MD IMTS, PGY-1 Pager: [phone] 05/18/2020,11:40 AM

## 2020-05-18 NOTE — Progress Notes (Signed)
[facility] Progress Note   49 y.o. male found  unresponsive in bed by the spouse. Asystole and VT, arrest x2 with known minimum 10 minutes down time. SHock +  positive for COVID19. CK was 37K and Troponin 9460 w/ a lactic acid >11 w/ WBC 31.8K, cocaine and ETOH positive. Patient was in florid shock + lactic acidosis and renal failure treated with broad spectrum abx, 3 pressors, steroids, Remdesivir, baricitinib and HCO3.  Initial pH of blood gas was 6.8.   Assessment/ Plan:   1. Renal failure - in florid failure from COVID/ sepsis and likelihood of survival is very low. But after d/w CCM will offer RRT with CRRT; will give a trial to see if there is any  improvement within a defined  time period. Rt fem cath (8/10). Options very limited and from renal standpoint only supportive but will use Oxiris filter to hopefully decrease inflammatory mediators. - Seen on CRRT. No UF still for now; still on high dose Levophed but now down to 2  Pressors. Continue  4K bath for now.  - On systemic heparin for possible DVT/ PE with elevated d-dimers as well, no issues with filter clotting.  2. COVID / septic shock - per CCM. Currently on HCO3 gtt as well and 2 pressors. 3. Cocaine positive - echo requested already with elevated CK and troponins. 4. S/p cardiac arrest  Subjective:   Down to 2 pressors but eyes deviated and 1 pupil non reactive. O2 req better and now down to 70% FIO2 with 10 PEEP.   Objective:   BP 108/75   Pulse 93   Temp (!) 97.3 F (36.3 C) (Bladder)   Resp (!) 28   Ht 6' (1.829 m)   Wt 102.3 kg   SpO2 96%   BMI 30.59 kg/m   Intake/Output Summary (Last 24 hours) at 05/18/2020 0735 Last data filed at 05/18/2020 0700 Gross per 24 hour  Intake 10769.39 ml  Output 3548 ml  Net 7221.39 ml   Weight change: 2.509 kg  Physical Exam: GEN: On vent, responds to pain HEENT: Conjunctival pallor LUNGS: CTA B/L but on vent CV: RRR, tachy at times ABD: SNDNT no BS  noted  EXT: tr lower extremity edema Access: femoral catheter    Imaging: DG Chest 1 View  Result Date: 05/20/2020 CLINICAL DATA:  49 year old male status post intubation. EXAM: CHEST  1 VIEW COMPARISON:  None. FINDINGS: Endotracheal tube with tip approximately 4.5 cm above the carina. Enteric tube extends below the diaphragm with tip in the proximal stomach. Bilateral upper lobe predominant streaky and nodular densities most consistent with pneumonia. Clinical correlation is recommended. No pleural effusion or pneumothorax. Mild cardiomegaly. No acute osseous pathology. IMPRESSION: 1. Endotracheal tube above the carina. 2. Bilateral upper lobe predominant pneumonia. Clinical correlation is recommended. Electronically Signed   By: Anner Crete M.D.   On: 06/06/2020 19:24   CT Head Wo Contrast  Result Date: 05/17/2020 CLINICAL DATA:  Mental status change and weakness. EXAM: CT HEAD WITHOUT CONTRAST TECHNIQUE: Contiguous axial images were obtained from the base of the skull through the vertex without intravenous contrast. COMPARISON:  None. 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 Recommend MRI brain for further evaluation. The ventricles are in the midline without mass effect or shift. No extra-axial fluid collections are identified. No CT findings for hemispheric infarction or intracranial hemorrhage. Vascular: No hyperdense vessels or aneurysm. Skull: No skull  fracture or bone lesions. Sinuses/Orbits: Pansinusitis. The mastoid air cells and middle ear cavities are clear. The globes are intact. Other: No scalp lesions or scalp hematoma. IMPRESSION: 1. 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 Recommend MRI brain for further evaluation. 2. No findings for infarction, hemorrhage or  mass. 3. Pansinusitis. Electronically Signed   By: Marijo Sanes M.D.   On: 05/17/2020 05:27   DG Chest Port 1 View  Result Date: 05/18/2020 CLINICAL DATA:  Respiratory failure EXAM: PORTABLE CHEST 1 VIEW COMPARISON:  05/17/2020, 05/15/2020 FINDINGS: Endotracheal tube tip is about 4.5 cm superior to the carina. Esophageal tube tip in the left upper quadrant. Right-sided central venous catheter tip over the SVC. Minimal left perihilar airspace opacity. Diffuse hazy appearance of right thorax with right pleural effusion. This finding is grossly unchanged. IMPRESSION: No significant interval change in diffuse hazy asymmetric appearance of the right thorax potentially due to layering pleural effusion versus asymmetric edema or pneumonia in the right thorax. Mild left perihilar infiltrates are unchanged. Electronically Signed   By: Donavan Foil M.D.   On: 05/18/2020 04:00   DG CHEST PORT 1 VIEW  Result Date: 05/17/2020 CLINICAL DATA:  Acute respiratory failure EXAM: PORTABLE CHEST 1 VIEW COMPARISON:  05/20/2020 FINDINGS: Endotracheal tube, enteric tube, and right IJ central line are unchanged. Patchy bilateral opacities, right greater than left remain present. No significant pleural effusion. No pneumothorax. Stable cardiomediastinal contours. IMPRESSION: Stable lines and tubes. No substantial change in patchy bilateral opacities, right greater than left. Electronically Signed   By: Macy Mis M.D.   On: 05/17/2020 15:08   DG Chest Portable 1 View  Result Date: 05/14/2020 CLINICAL DATA:  49 year old male with central line placement. EXAM: PORTABLE CHEST 1 VIEW COMPARISON:  Earlier radiograph dated 05/17/2020. FINDINGS: Interval placement of a right IJ central venous line with tip over central SVC. No pneumothorax. Endotracheal tube remains above the carina and enteric tube extends below the diaphragm. Interval worsening of bilateral pulmonary densities, right greater left. No large pleural effusion.  Stable cardiac silhouette. No acute osseous pathology. IMPRESSION: 1. Interval placement of a right IJ central venous line with tip over central SVC. No pneumothorax. 2. Interval worsening of the bilateral pulmonary densities. Electronically Signed   By: Anner Crete M.D.   On: 05/15/2020 21:37   ECHOCARDIOGRAM COMPLETE  Result Date: 05/17/2020    ECHOCARDIOGRAM REPORT   Patient Name:   Stephen Fisher Date of Exam: 05/17/2020 Medical Rec #:  [id number]            Height:       72.0 in Accession #:    [id number]           Weight:       220.0 lb Date of Birth:  [birth year]            BSA:          2.219 m Patient Age:    78 years             BP:           112/95 mmHg Patient Gender: M                    HR:           115 bpm. Exam Location:  Inpatient Procedure: 2D Echo Indications:    cardiac arrest I46.9  History:        Patient has  no prior history of Echocardiogram examinations. No                 prior cardiac hx on file. cocaine & ETOH use per HX.  Sonographer:    Jannett Celestine RDCS (AE) Referring Phys: [place]  1. Left ventricular ejection fraction, by estimation, is 65 to 70%. The left ventricle has hyperdynamic function. The left ventricle has no regional wall motion abnormalities. There is moderate left ventricular hypertrophy. Left ventricular diastolic parameters are indeterminate.  2. Right ventricular systolic function is normal. The right ventricular size is normal. Tricuspid regurgitation signal is inadequate for assessing PA pressure.  3. The mitral valve is normal in structure. No evidence of mitral valve regurgitation. No evidence of mitral stenosis.  4. The aortic valve is tricuspid. Aortic valve regurgitation is not visualized. No aortic stenosis is present.  5. The inferior vena cava is normal in size with greater than 50% respiratory variability, suggesting right atrial pressure of 3 mmHg.  6. Technically difficult study with poor acoustic windows.  FINDINGS  Left Ventricle: Left ventricular ejection fraction, by estimation, is 65 to 70%. The left ventricle has hyperdynamic function. The left ventricle has no regional wall motion abnormalities. The left ventricular internal cavity size was normal in size. There is moderate left ventricular hypertrophy. Left ventricular diastolic parameters are indeterminate. Right Ventricle: The right ventricular size is normal. No increase in right ventricular wall thickness. Right ventricular systolic function is normal. Tricuspid regurgitation signal is inadequate for assessing PA pressure. Left Atrium: Left atrial size was normal in size. Right Atrium: Right atrial size was normal in size. Pericardium: There is no evidence of pericardial effusion. Mitral Valve: The mitral valve is normal in structure. No evidence of mitral valve regurgitation. No evidence of mitral valve stenosis. Tricuspid Valve: The tricuspid valve is not well visualized. Tricuspid valve regurgitation is not demonstrated. Aortic Valve: The aortic valve is tricuspid. Aortic valve regurgitation is not visualized. No aortic stenosis is present. Pulmonic Valve: The pulmonic valve was not well visualized. Pulmonic valve regurgitation is not visualized. Aorta: The aortic root is normal in size and structure. Venous: The inferior vena cava is normal in size with greater than 50% respiratory variability, suggesting right atrial pressure of 3 mmHg. IAS/Shunts: No atrial level shunt detected by color flow Doppler.  LEFT VENTRICLE PLAX 2D LVIDd:         3.84 cm LVIDs:         2.07 cm LV PW:         1.23 cm LV IVS:        1.62 cm LVOT diam:     2.60 cm LV SV:         75 LV SV Index:   34 LVOT Area:     5.31 cm  RIGHT VENTRICLE RV S prime:     14.80 cm/s TAPSE (M-mode): 1.5 cm LEFT ATRIUM         Index LA diam:    2.50 cm 1.13 cm/m  AORTIC VALVE LVOT Vmax:   130.00 cm/s LVOT Vmean:  60.500 cm/s LVOT VTI:    0.142 m  AORTA Ao Root diam: 3.40 cm MITRAL VALVE MV Area  (PHT): 3.99 cm    SHUNTS MV Decel Time: 190 msec    Systemic VTI:  0.14 m MV E velocity: 41.60 cm/s  Systemic Diam: 2.60 cm MV A velocity: 54.70 cm/s MV E/A ratio:  0.76 Loralie Champagne MD Electronically signed by Kirk Ruths  Mclean MD Signature Date/Time: 05/17/2020/7:30:52 PM    Final    VAS [place] LOWER EXTREMITY VENOUS (DVT)  Result Date: 05/17/2020  Lower Venous DVTStudy Indications: Swelling, Edema, and elevated ddimer.  Limitations: Bandages and line. Comparison Study: no prior Performing Technologist: Abram Sander RVS  Examination Guidelines: A complete evaluation includes B-mode imaging, spectral Doppler, color Doppler, and power Doppler as needed of all accessible portions of each vessel. Bilateral testing is considered an integral part of a complete examination. Limited examinations for reoccurring indications may be performed as noted. The reflux portion of the exam is performed with the patient in reverse Trendelenburg.  +---------+---------------+---------+-----------+----------+--------------+ RIGHT    CompressibilityPhasicitySpontaneityPropertiesThrombus Aging +---------+---------------+---------+-----------+----------+--------------+ CFV                                                   Not visualized +---------+---------------+---------+-----------+----------+--------------+ SFJ                                                   Not visualized +---------+---------------+---------+-----------+----------+--------------+ FV Prox                                               Not visualized +---------+---------------+---------+-----------+----------+--------------+ FV Mid   Full                                                        +---------+---------------+---------+-----------+----------+--------------+ FV DistalFull           Yes                                          +---------+---------------+---------+-----------+----------+--------------+ PFV                                                    Not visualized +---------+---------------+---------+-----------+----------+--------------+ POP      Full           Yes      Yes                                 +---------+---------------+---------+-----------+----------+--------------+ PTV      Full                                                        +---------+---------------+---------+-----------+----------+--------------+ PERO     Full                                                        +---------+---------------+---------+-----------+----------+--------------+   +---------+---------------+---------+-----------+----------+--------------+  LEFT     CompressibilityPhasicitySpontaneityPropertiesThrombus Aging +---------+---------------+---------+-----------+----------+--------------+ CFV      Full           Yes      Yes                                 +---------+---------------+---------+-----------+----------+--------------+ SFJ      Full                                                        +---------+---------------+---------+-----------+----------+--------------+ FV Prox  Full                                                        +---------+---------------+---------+-----------+----------+--------------+ FV Mid   Full                                                        +---------+---------------+---------+-----------+----------+--------------+ FV DistalFull                                                        +---------+---------------+---------+-----------+----------+--------------+ PFV      Full                                                        +---------+---------------+---------+-----------+----------+--------------+ POP      Full           Yes      Yes                                 +---------+---------------+---------+-----------+----------+--------------+ PTV      Full                                                         +---------+---------------+---------+-----------+----------+--------------+ PERO     Full                                                        +---------+---------------+---------+-----------+----------+--------------+     Summary: BILATERAL: - No evidence of superficial venous thrombosis in the lower extremities, bilaterally. - RIGHT: - There is no evidence of deep vein thrombosis in the lower extremity. However, portions of this examination were limited- see technologist comments above.  LEFT: -  There is no evidence of deep vein thrombosis in the lower extremity.  *See table(s) above for measurements and observations. Electronically signed by Curt Jews MD on 05/17/2020 at 10:28:24 PM.    Final     Labs: BMET Recent Labs  Lab 05/18/2020 2049 06/04/2020 2341 05/17/20 0725 05/17/20 0725 05/17/20 6579 05/17/20 1355 05/17/20 1422 05/17/20 1742 05/17/20 2153 05/18/20 0132 05/18/20 0420  NA 147*   < > 147*   < > 143 144 144 144 140 143 144  K 7.3*   < > 5.0   < > 4.7 4.5 4.6 3.8 3.5 3.7 4.1  CL 110  --  104  --   --   --  104 107 106 108 107  CO2 11*  --  9*  --   --   --  11* 15* 18* 20* 20*  GLUCOSE 83  --  383*  --   --   --  517* 453* 381* 214* 159*  BUN 14  --  21*  --   --   --  31* 35* 38* 30* 29*  CREATININE 2.49*  --  3.64*  --   --   --  4.71* 4.92* 4.99* 4.39* 4.32*  CALCIUM 6.4*  --  6.4*  --   --   --  6.1* 7.7* 6.6* 6.9* 6.9*  PHOS >30.0*  --  9.8*  --   --   --   --  4.9*  --   --  4.8*  4.7*   < > = values in this interval not displayed.   CBC Recent Labs  Lab 05/17/20 0725 05/17/20 0852 05/17/20 1355 05/18/20 0420  WBC 31.8*  --   --  20.9*  HGB 16.5 16.3 17.3* 17.2*  HCT 55.3* 48.0 51.0 50.7  MCV 93.9  --   --  81.9  PLT 258  --   --  192    Medications:    . baricitinib  2 mg Oral Daily  . chlorhexidine gluconate (MEDLINE KIT)  15 mL Mouth Rinse BID  . Chlorhexidine Gluconate Cloth  6 each Topical Daily  . famotidine  20 mg Per Tube Daily  .  folic acid  1 mg Intravenous Daily  . hydrocortisone sod succinate (SOLU-CORTEF) inj  100 mg Intravenous Q8H  . lactulose  30 g Per Tube TID  . mouth rinse  15 mL Mouth Rinse 10 times per day  . sodium chloride flush  10-40 mL Intracatheter Q12H  . thiamine  100 mg Intravenous Daily      Otelia Santee, MD 05/18/2020, 7:35 AM

## 2020-05-18 NOTE — Progress Notes (Signed)
Made Dr. Nancee Liter aware that RN could not get a accurate O2 reading on patient after trying several different options throughout the day.  MD aware and RN will continue to monitor patient.

## 2020-05-19 DIAGNOSIS — J069 Acute upper respiratory infection, unspecified: Secondary | ICD-10-CM

## 2020-05-19 DIAGNOSIS — J9611 Chronic respiratory failure with hypoxia: Secondary | ICD-10-CM

## 2020-05-19 LAB — BASIC METABOLIC PANEL
Anion gap: 13 (ref 5–15)
Anion gap: 15 (ref 5–15)
BUN: 24 mg/dL — ABNORMAL HIGH (ref 6–20)
BUN: 28 mg/dL — ABNORMAL HIGH (ref 6–20)
CO2: 23 mmol/L (ref 22–32)
CO2: 23 mmol/L (ref 22–32)
Calcium: 6.7 mg/dL — ABNORMAL LOW (ref 8.9–10.3)
Calcium: 7.2 mg/dL — ABNORMAL LOW (ref 8.9–10.3)
Chloride: 100 mmol/L (ref 98–111)
Chloride: 101 mmol/L (ref 98–111)
Creatinine, Ser: 4.07 mg/dL — ABNORMAL HIGH (ref 0.61–1.24)
Creatinine, Ser: 4.06 mg/dL — ABNORMAL HIGH (ref 0.61–1.24)
GFR calc Af Amer: 19 mL/min — ABNORMAL LOW (ref >60)
GFR calc Af Amer: 19 mL/min — ABNORMAL LOW (ref >60)
GFR calc non Af Amer: 16 mL/min — ABNORMAL LOW (ref >60)
GFR calc non Af Amer: 16 mL/min — ABNORMAL LOW (ref >60)
Glucose, Bld: 184 mg/dL — ABNORMAL HIGH (ref 70–99)
Glucose, Bld: 178 mg/dL — ABNORMAL HIGH (ref 70–99)
Potassium: 3.9 mmol/L (ref 3.5–5.1)
Potassium: 4.6 mmol/L (ref 3.5–5.1)
Sodium: 136 mmol/L (ref 135–145)
Sodium: 139 mmol/L (ref 135–145)

## 2020-05-19 LAB — POCT ACTIVATED CLOTTING TIME
Activated Clotting Time: 147 s
Activated Clotting Time: 147 seconds
Activated Clotting Time: 158 seconds
Activated Clotting Time: 158 seconds
Activated Clotting Time: 164 seconds
Activated Clotting Time: 164 seconds
Activated Clotting Time: 180 seconds
Activated Clotting Time: 191 s
Activated Clotting Time: 208 seconds
Activated Clotting Time: 147 s
Activated Clotting Time: 147 seconds
Activated Clotting Time: 153 seconds
Activated Clotting Time: 158 seconds
Activated Clotting Time: 158 seconds
Activated Clotting Time: 164 seconds
Activated Clotting Time: 169 s
Activated Clotting Time: 186 seconds
Activated Clotting Time: 186 seconds
Activated Clotting Time: 202 seconds
Activated Clotting Time: 202 seconds
Activated Clotting Time: 202 seconds
Activated Clotting Time: 208 seconds

## 2020-05-19 LAB — APTT: aPTT: 38 seconds — ABNORMAL HIGH (ref 24–36)

## 2020-05-19 LAB — RENAL FUNCTION PANEL
Albumin: 1.9 g/dL — ABNORMAL LOW (ref 3.5–5.0)
Albumin: 2.2 g/dL — ABNORMAL LOW (ref 3.5–5.0)
Anion gap: 12 (ref 5–15)
Anion gap: 12 (ref 5–15)
BUN: 27 mg/dL — ABNORMAL HIGH (ref 6–20)
BUN: 28 mg/dL — ABNORMAL HIGH (ref 6–20)
CO2: 25 mmol/L (ref 22–32)
CO2: 26 mmol/L (ref 22–32)
Calcium: 6.6 mg/dL — ABNORMAL LOW (ref 8.9–10.3)
Calcium: 7.3 mg/dL — ABNORMAL LOW (ref 8.9–10.3)
Chloride: 101 mmol/L (ref 98–111)
Chloride: 100 mmol/L (ref 98–111)
Creatinine, Ser: 4.28 mg/dL — ABNORMAL HIGH (ref 0.61–1.24)
Creatinine, Ser: 3.97 mg/dL — ABNORMAL HIGH (ref 0.61–1.24)
GFR calc Af Amer: 18 mL/min — ABNORMAL LOW (ref >60)
GFR calc Af Amer: 20 mL/min — ABNORMAL LOW (ref >60)
GFR calc non Af Amer: 15 mL/min — ABNORMAL LOW (ref >60)
GFR calc non Af Amer: 17 mL/min — ABNORMAL LOW (ref >60)
Glucose, Bld: 185 mg/dL — ABNORMAL HIGH (ref 70–99)
Glucose, Bld: 147 mg/dL — ABNORMAL HIGH (ref 70–99)
Phosphorus: 2.3 mg/dL — ABNORMAL LOW (ref 2.5–4.6)
Phosphorus: 3.4 mg/dL (ref 2.5–4.6)
Potassium: 4.2 mmol/L (ref 3.5–5.1)
Potassium: 4.8 mmol/L (ref 3.5–5.1)
Sodium: 138 mmol/L (ref 135–145)
Sodium: 138 mmol/L (ref 135–145)

## 2020-05-19 LAB — GLUCOSE, CAPILLARY
Glucose-Capillary: 123 mg/dL — ABNORMAL HIGH (ref 70–99)
Glucose-Capillary: 130 mg/dL — ABNORMAL HIGH (ref 70–99)
Glucose-Capillary: 135 mg/dL — ABNORMAL HIGH (ref 70–99)
Glucose-Capillary: 139 mg/dL — ABNORMAL HIGH (ref 70–99)
Glucose-Capillary: 155 mg/dL — ABNORMAL HIGH (ref 70–99)
Glucose-Capillary: 157 mg/dL — ABNORMAL HIGH (ref 70–99)
Glucose-Capillary: 160 mg/dL — ABNORMAL HIGH (ref 70–99)
Glucose-Capillary: 160 mg/dL — ABNORMAL HIGH (ref 70–99)
Glucose-Capillary: 168 mg/dL — ABNORMAL HIGH (ref 70–99)
Glucose-Capillary: 169 mg/dL — ABNORMAL HIGH (ref 70–99)
Glucose-Capillary: 177 mg/dL — ABNORMAL HIGH (ref 70–99)
Glucose-Capillary: 195 mg/dL — ABNORMAL HIGH (ref 70–99)

## 2020-05-19 LAB — CBC
HCT: 43.4 % (ref 39.0–52.0)
Hemoglobin: 14.7 g/dL (ref 13.0–17.0)
MCH: 27.6 pg (ref 26.0–34.0)
MCHC: 33.9 g/dL (ref 30.0–36.0)
MCV: 81.4 fL (ref 80.0–100.0)
Platelets: 132 10*3/uL — ABNORMAL LOW (ref 150–400)
RBC: 5.33 MIL/uL (ref 4.22–5.81)
RDW: 13.2 % (ref 11.5–15.5)
WBC: 16.7 10*3/uL — ABNORMAL HIGH (ref 4.0–10.5)
nRBC: 1.4 % — ABNORMAL HIGH (ref 0.0–0.2)

## 2020-05-19 LAB — CK: Total CK: 37988 U/L — ABNORMAL HIGH (ref 49–397)

## 2020-05-19 LAB — TRIGLYCERIDES: Triglycerides: 132 mg/dL (ref <150)

## 2020-05-19 LAB — LACTIC ACID, PLASMA
Lactic Acid, Venous: 3.9 mmol/L (ref 0.5–1.9)
Lactic Acid, Venous: 4.3 mmol/L (ref 0.5–1.9)

## 2020-05-19 LAB — MAGNESIUM: Magnesium: 1.7 mg/dL (ref 1.7–2.4)

## 2020-05-19 MED ORDER — INSULIN GLARGINE 100 UNIT/ML ~~LOC~~ SOLN
20.0000 [IU] | Freq: Every day | SUBCUTANEOUS | Status: DC
  Administered 2020-05-19 – 2020-05-21 (×3): 20 [IU] via SUBCUTANEOUS
  Filled 2020-05-19 (×4): qty 0.2

## 2020-05-19 MED ORDER — INSULIN GLARGINE 100 UNIT/ML ~~LOC~~ SOLN: 20.0000 [IU] | Freq: Every day | SUBCUTANEOUS | Status: DC

## 2020-05-19 MED ORDER — HEPARIN SODIUM (PORCINE) 10000 UNIT/ML IJ SOLN: 7500.0000 [IU] | Freq: Three times a day (TID) | INTRAMUSCULAR | Status: DC

## 2020-05-19 MED ORDER — INSULIN ASPART 100 UNIT/ML ~~LOC~~ SOLN: 0.0000 [IU] | Freq: Three times a day (TID) | SUBCUTANEOUS | Status: DC

## 2020-05-19 MED ORDER — INSULIN ASPART 100 UNIT/ML ~~LOC~~ SOLN
0.0000 [IU] | SUBCUTANEOUS | Status: DC
  Administered 2020-05-19: 4 [IU] via SUBCUTANEOUS
  Administered 2020-05-19 – 2020-05-21 (×9): 3 [IU] via SUBCUTANEOUS
  Administered 2020-05-22: 4 [IU] via SUBCUTANEOUS
  Administered 2020-05-22: 3 [IU] via SUBCUTANEOUS
  Administered 2020-05-22 (×2): 4 [IU] via SUBCUTANEOUS
  Administered 2020-05-22 (×2): 3 [IU] via SUBCUTANEOUS
  Administered 2020-05-22 – 2020-05-23 (×3): 4 [IU] via SUBCUTANEOUS
  Administered 2020-05-23: 3 [IU] via SUBCUTANEOUS
  Administered 2020-05-23 (×3): 4 [IU] via SUBCUTANEOUS
  Administered 2020-05-24: 5 [IU] via SUBCUTANEOUS
  Administered 2020-05-24: 4 [IU] via SUBCUTANEOUS
  Administered 2020-05-24 (×2): 7 [IU] via SUBCUTANEOUS
  Administered 2020-05-24: 3 [IU] via SUBCUTANEOUS
  Administered 2020-05-25: 4 [IU] via SUBCUTANEOUS
  Administered 2020-05-25: 7 [IU] via SUBCUTANEOUS
  Administered 2020-05-25: 11 [IU] via SUBCUTANEOUS
  Administered 2020-05-25 (×2): 4 [IU] via SUBCUTANEOUS
  Administered 2020-05-25 – 2020-05-26 (×6): 7 [IU] via SUBCUTANEOUS

## 2020-05-19 MED ORDER — HEPARIN SODIUM (PORCINE) 10000 UNIT/ML IJ SOLN
7500.0000 [IU] | Freq: Three times a day (TID) | INTRAMUSCULAR | Status: DC
  Administered 2020-05-19 – 2020-05-20 (×3): 7500 [IU] via SUBCUTANEOUS
  Filled 2020-05-19 (×4): qty 1

## 2020-05-19 NOTE — Progress Notes (Addendum)
[facility] Progress Note   49 y.o.malefound  unresponsive in bed by the spouse. Asystole and VT, arrest x2 with known minimum 10 minutes down time. SHock +  positive for COVID19. CK was 37K and Troponin 9460 w/ a lactic acid >11 w/ WBC 31.8K, cocaine and ETOH positive. Patient was in florid shock + lactic acidosis and renal failure treated with broad spectrum abx, 3 pressors, steroids, Remdesivir, baricitinib and HCO3. Initial pH of blood gas was 6.8.   Assessment/ Plan:   1. Renal failure - in florid failure from COVID/ sepsis and likelihood of survival is very low. But after d/w CCM will offer RRT with CRRT; will give a trial to see if there is any improvement within a defined time period. Rt fem cath (8/10). Options very limited and from renal standpoint only supportive but will use Oxiris filter to hopefully decrease inflammatory mediators. - Seen on CRRT. No UF still for now; still on  Levophed but now down to 2  Pressors. Continue  4K bath for now.  Will change to regular filter with next filter change + continue  Heparin prefilter; next step will be systemic heparin  2. COVID / septic shock - per CCM. Currently on HCO3 gtt as well and 2 pressors. 3. Cocaine positive - echo requested already with elevated CK and troponins. 4. S/p cardiac arrest  Subjective:   Down to 2 pressors but eyes deviated and 1 pupil non reactive. O2 req better and now down to 50% FIO2 with 10-12 PEEP. Clotted off filter twice last night on the Oxiris   Objective:   BP (!) 101/55    Pulse 93    Temp 98.4 F (36.9 C) (Axillary)    Resp (!) 30    Ht 6' (1.829 m)    Wt 103.2 kg    SpO2 97%    BMI 30.86 kg/m   Intake/Output Summary (Last 24 hours) at 05/19/2020 1113 Last data filed at 05/19/2020 1100 Gross per 24 hour  Intake 8320.03 ml  Output 6654 ml  Net 1666.03 ml   Weight change: 0.9 kg  Physical Exam: GEN: On vent, responds to pain HEENT: Conjunctival pallor LUNGS: CTA B/L  but on vent CV: RRR, tachy at times ABD: SNDNT no BS noted  EXT: tr lower extremity edema Access: rt femoral catheter  Imaging: DG Chest Port 1 View  Result Date: 05/18/2020 CLINICAL DATA:  Respiratory failure EXAM: PORTABLE CHEST 1 VIEW COMPARISON:  05/17/2020, 05/07/2020 FINDINGS: Endotracheal tube tip is about 4.5 cm superior to the carina. Esophageal tube tip in the left upper quadrant. Right-sided central venous catheter tip over the SVC. Minimal left perihilar airspace opacity. Diffuse hazy appearance of right thorax with right pleural effusion. This finding is grossly unchanged. IMPRESSION: No significant interval change in diffuse hazy asymmetric appearance of the right thorax potentially due to layering pleural effusion versus asymmetric edema or pneumonia in the right thorax. Mild left perihilar infiltrates are unchanged. Electronically Signed   By: Donavan Foil M.D.   On: 05/18/2020 04:00   DG CHEST PORT 1 VIEW  Result Date: 05/17/2020 CLINICAL DATA:  Acute respiratory failure EXAM: PORTABLE CHEST 1 VIEW COMPARISON:  06/02/2020 FINDINGS: Endotracheal tube, enteric tube, and right IJ central line are unchanged. Patchy bilateral opacities, right greater than left remain present. No significant pleural effusion. No pneumothorax. Stable cardiomediastinal contours. IMPRESSION: Stable lines and tubes. No substantial change in patchy bilateral opacities, right greater than left. Electronically Signed   By: Malachi Carl  Patel M.D.   On: 05/17/2020 15:08   ECHOCARDIOGRAM COMPLETE  Result Date: 05/17/2020    ECHOCARDIOGRAM REPORT   Patient Name:   [facility] Date of Exam: 05/17/2020 Medical Rec #:  [id number]            Height:       72.0 in Accession #:    [id number]           Weight:       220.0 lb Date of Birth:  13-Oct-1970            BSA:          2.219 m Patient Age:    77 years             BP:           112/95 mmHg Patient Gender: M                    HR:           115 bpm. Exam  Location:  Inpatient Procedure: 2D Echo Indications:    cardiac arrest I46.9  History:        Patient has no prior history of Echocardiogram examinations. No                 prior cardiac hx on file. cocaine & ETOH use per HX.  Sonographer:    Jannett Celestine RDCS (AE) Referring Phys: [place]  1. Left ventricular ejection fraction, by estimation, is 65 to 70%. The left ventricle has hyperdynamic function. The left ventricle has no regional wall motion abnormalities. There is moderate left ventricular hypertrophy. Left ventricular diastolic parameters are indeterminate.  2. Right ventricular systolic function is normal. The right ventricular size is normal. Tricuspid regurgitation signal is inadequate for assessing PA pressure.  3. The mitral valve is normal in structure. No evidence of mitral valve regurgitation. No evidence of mitral stenosis.  4. The aortic valve is tricuspid. Aortic valve regurgitation is not visualized. No aortic stenosis is present.  5. The inferior vena cava is normal in size with greater than 50% respiratory variability, suggesting right atrial pressure of 3 mmHg.  6. Technically difficult study with poor acoustic windows. FINDINGS  Left Ventricle: Left ventricular ejection fraction, by estimation, is 65 to 70%. The left ventricle has hyperdynamic function. The left ventricle has no regional wall motion abnormalities. The left ventricular internal cavity size was normal in size. There is moderate left ventricular hypertrophy. Left ventricular diastolic parameters are indeterminate. Right Ventricle: The right ventricular size is normal. No increase in right ventricular wall thickness. Right ventricular systolic function is normal. Tricuspid regurgitation signal is inadequate for assessing PA pressure. Left Atrium: Left atrial size was normal in size. Right Atrium: Right atrial size was normal in size. Pericardium: There is no evidence of pericardial effusion. Mitral  Valve: The mitral valve is normal in structure. No evidence of mitral valve regurgitation. No evidence of mitral valve stenosis. Tricuspid Valve: The tricuspid valve is not well visualized. Tricuspid valve regurgitation is not demonstrated. Aortic Valve: The aortic valve is tricuspid. Aortic valve regurgitation is not visualized. No aortic stenosis is present. Pulmonic Valve: The pulmonic valve was not well visualized. Pulmonic valve regurgitation is not visualized. Aorta: The aortic root is normal in size and structure. Venous: The inferior vena cava is normal in size with greater than 50% respiratory variability, suggesting right atrial pressure of 3 mmHg. IAS/Shunts: No atrial level shunt  detected by color flow Doppler.  LEFT VENTRICLE PLAX 2D LVIDd:         3.84 cm LVIDs:         2.07 cm LV PW:         1.23 cm LV IVS:        1.62 cm LVOT diam:     2.60 cm LV SV:         75 LV SV Index:   34 LVOT Area:     5.31 cm  RIGHT VENTRICLE RV S prime:     14.80 cm/s TAPSE (M-mode): 1.5 cm LEFT ATRIUM         Index LA diam:    2.50 cm 1.13 cm/m  AORTIC VALVE LVOT Vmax:   130.00 cm/s LVOT Vmean:  60.500 cm/s LVOT VTI:    0.142 m  AORTA Ao Root diam: 3.40 cm MITRAL VALVE MV Area (PHT): 3.99 cm    SHUNTS MV Decel Time: 190 msec    Systemic VTI:  0.14 m MV E velocity: 41.60 cm/s  Systemic Diam: 2.60 cm MV A velocity: 54.70 cm/s MV E/A ratio:  0.76 Loralie Champagne MD Electronically signed by Loralie Champagne MD Signature Date/Time: 05/17/2020/7:30:52 PM    Final    VAS [place] LOWER EXTREMITY VENOUS (DVT)  Result Date: 05/17/2020  Lower Venous DVTStudy Indications: Swelling, Edema, and elevated ddimer.  Limitations: Bandages and line. Comparison Study: no prior Performing Technologist: Abram Sander RVS  Examination Guidelines: A complete evaluation includes B-mode imaging, spectral Doppler, color Doppler, and power Doppler as needed of all accessible portions of each vessel. Bilateral testing is considered an integral part of a  complete examination. Limited examinations for reoccurring indications may be performed as noted. The reflux portion of the exam is performed with the patient in reverse Trendelenburg.  +---------+---------------+---------+-----------+----------+--------------+  RIGHT     Compressibility Phasicity Spontaneity Properties Thrombus Aging  +---------+---------------+---------+-----------+----------+--------------+  CFV                                                        Not visualized  +---------+---------------+---------+-----------+----------+--------------+  SFJ                                                        Not visualized  +---------+---------------+---------+-----------+----------+--------------+  FV Prox                                                    Not visualized  +---------+---------------+---------+-----------+----------+--------------+  FV Mid    Full                                                             +---------+---------------+---------+-----------+----------+--------------+  FV Distal Full            Yes                                              +---------+---------------+---------+-----------+----------+--------------+  PFV                                                        Not visualized  +---------+---------------+---------+-----------+----------+--------------+  POP       Full            Yes       Yes                                    +---------+---------------+---------+-----------+----------+--------------+  PTV       Full                                                             +---------+---------------+---------+-----------+----------+--------------+  PERO      Full                                                             +---------+---------------+---------+-----------+----------+--------------+   +---------+---------------+---------+-----------+----------+--------------+  LEFT      Compressibility Phasicity Spontaneity Properties Thrombus Aging   +---------+---------------+---------+-----------+----------+--------------+  CFV       Full            Yes       Yes                                    +---------+---------------+---------+-----------+----------+--------------+  SFJ       Full                                                             +---------+---------------+---------+-----------+----------+--------------+  FV Prox   Full                                                             +---------+---------------+---------+-----------+----------+--------------+  FV Mid    Full                                                             +---------+---------------+---------+-----------+----------+--------------+  FV Distal Full                                                             +---------+---------------+---------+-----------+----------+--------------+  PFV       Full                                                             +---------+---------------+---------+-----------+----------+--------------+  POP       Full            Yes       Yes                                    +---------+---------------+---------+-----------+----------+--------------+  PTV       Full                                                             +---------+---------------+---------+-----------+----------+--------------+  PERO      Full                                                             +---------+---------------+---------+-----------+----------+--------------+     Summary: BILATERAL: - No evidence of superficial venous thrombosis in the lower extremities, bilaterally. - RIGHT: - There is no evidence of deep vein thrombosis in the lower extremity. However, portions of this examination were limited- see technologist comments above.  LEFT: - There is no evidence of deep vein thrombosis in the lower extremity.  *See table(s) above for measurements and observations. Electronically signed by Curt Jews MD on 05/17/2020 at 10:28:24 PM.    Final      Labs: BMET Recent Labs  Lab 05/14/2020 2049 05/19/2020 2341 05/17/20 0725 05/17/20 3299 05/17/20 1742 05/17/20 2153 05/18/20 0132 05/18/20 0132 05/18/20 0420 05/18/20 1130 05/18/20 1144 05/18/20 1515 05/18/20 2027 05/18/20 2252 05/19/20 0330  NA 147*   < > 147*   < > 144   < > 143   < > 144 141 142 139 139 136 138  K 7.3*   < > 5.0   < > 3.8   < > 3.7   < > 4.1 3.9 3.8 4.0 3.9 3.9 4.2  CL 110   < > 104   < > 107   < > 108  --  107 106  --  104 105 100 101  CO2 11*   < > 9*   < > 15*   < > 20*  --  20* 22  --  _0 GLUCOSE 83   < > 383*   < > 453*   < > 214*  --  159* 176*  --  186* 189* 184* 185*  BUN 14   < > 21*   < > 35*   < > 30*  --  29* 28*  --  24* 26* 24* 27*  CREATININE 2.49*   < > 3.64*   < > 4.92*   < > 4.39*  --  4.32* 4.25*  --  4.06*  3.95* 4.07* 4.28*  CALCIUM 6.4*   < > 6.4*   < > 7.7*   < > 6.9*  --  6.9* 6.7*  --  6.9* 6.8* 6.7* 6.6*  PHOS >30.0*  --  9.8*  --  4.9*  --   --   --  4.8*   4.7*  --   --  2.6  --   --  2.3*   < > = values in this interval not displayed.   CBC Recent Labs  Lab 05/17/20 0725 05/17/20 0852 05/17/20 1355 05/18/20 0420 05/18/20 1144 05/19/20 0330  WBC 31.8*  --   --  20.9*  --  16.7*  HGB 16.5   < > 17.3* 17.2* 16.0 14.7  HCT 55.3*   < > 51.0 50.7 47.0 43.4  MCV 93.9  --   --  81.9  --  81.4  PLT 258  --   --  192  --  132*   < > = values in this interval not displayed.    Medications:     baricitinib  2 mg Oral Daily   chlorhexidine gluconate (MEDLINE KIT)  15 mL Mouth Rinse BID   Chlorhexidine Gluconate Cloth  6 each Topical Daily   docusate  100 mg Per Tube BID   folic acid  1 mg Per Tube Daily   heparin injection (subcutaneous)  7,500 Units Subcutaneous Q8H   hydrocortisone sod succinate (SOLU-CORTEF) inj  100 mg Intravenous Q8H   insulin aspart  0-20 Units Subcutaneous Q4H   insulin glargine  20 Units Subcutaneous Daily   lactulose  30 g Per Tube TID   mouth rinse  15 mL Mouth Rinse 10  times per day   pantoprazole sodium  40 mg Per Tube Daily   sodium chloride flush  10-40 mL Intracatheter Q12H   thiamine  100 mg Per Tube Daily   vitamin B-12  100 mcg Per Tube Daily      Otelia Santee, MD 05/19/2020, 11:13 AM

## 2020-05-19 NOTE — Progress Notes (Addendum)
.   NAME:  [facility], MRN:  [id number], DOB:  Apr 20, 1971, LOS: 3 ADMISSION DATE:  06/01/2020, CONSULTATION DATE:  05/07/2020 REFERRING MD:  Dr. Renae Gloss, CHIEF COMPLAINT:  Cardiac Arrest    History of present illness   49 year old male presents to ED s/p Cardiac Arrest. Per [facility] patient was drinking last night when they got into an argument and he left the house around midnight. He returned to the house this AM and they continued drinking, he then passed out around lunch time and she rolled him on his side and left to go take a nap. Hours later when she came back he was snoring and she was unable to wake him up. She called EMS. When EMS arrived patient was pulseless in asystole. ROSC after estimated 10 minutes, one episode of V-Tach requiring Defib. ROSC was achieved briefly before loss of pulse again. On arrival patient undergoing CPR. Per EMS was given 3 EPI in transit. ROSC achieved briefly after arrival.  ABG 6.831/74/420/15. AST/ALT 5312/5405. Troponin 953. Given Calcium G., EPI, Bicarb in ED. Started on Levophed gtt. UDS +Cocaine. ETOH Level 123.   Past Medical History  Polysubstance Abuse, ETOH Abuse   Significant Hospital Events   8/10 > Presents to ED  8/13> Off Endo tool  Consults:  PCCM  Procedures:  ETT 8/10 >> 8/11>> Femoral CVC 8/11>> Femoral a-line   Significant Diagnostic Tests:  CT Head 8/10 > ? PRES CXR 8/10 > ? Upper lobe PNA, presumed 2/2 COVID 8/11 Echo> LVEF 65-70%, moderate LV hypertrophy, RV function normal   Micro Data:  Blood 8/10 >> NGTD U/A 8/10 >> Sputum 8/10 >> COVID 8/10 > POS MRSA 8/12> Negative  Antimicrobials:  Zosyn 8/10 >>   Interim history/subjective:  Off Epi, still continues on Vaso and Levo. Receiving CRRT. Anion Gap has closed, and ABG better on CRRT. discontinue insulin today. Will wean off fentanyl.  Objective   Blood pressure (!) 101/55, pulse 89, temperature 99 F (37.2 C), temperature source Axillary, resp. rate  (!) 28, height 6' (1.829 m), weight 103.2 kg, SpO2 96 %.    Vent Mode: PRVC FiO2 (%):  [50 %-70 %] 50 % Set Rate:  [28 bmp] 28 bmp Vt Set:  [620 mL] 620 mL PEEP:  [10 cmH20] 10 cmH20 Plateau Pressure:  [22 cmH20-24 cmH20] 24 cmH20   Intake/Output Summary (Last 24 hours) at 05/19/2020 0626 Last data filed at 05/19/2020 0600 Gross per 24 hour  Intake 8538.15 ml  Output 6648 ml  Net 1890.15 ml   Filed Weights   05/23/2020 1906 05/18/20 0500 05/19/20 0400  Weight: 99.8 kg 102.3 kg 103.2 kg   Physical Exam Constitutional:      Appearance: He is obese. He is not diaphoretic.     Comments: Intubated and sedated  HENT:     Head: Normocephalic.  Eyes:     Comments: Pinpoint pupils on physical examination. Slight corneal reflex, no dolls eyes reflex.   Cardiovascular:     Rate and Rhythm: Normal rate and regular rhythm.     Pulses: Normal pulses.     Heart sounds: Normal heart sounds. No murmur heard.  No friction rub. No gallop.   Pulmonary:     Breath sounds: Normal breath sounds. No wheezing, rhonchi or rales.  Abdominal:     General: Abdomen is flat. Bowel sounds are normal.     Tenderness: There is no abdominal tenderness.  Musculoskeletal:     Right lower leg: No edema.  Left lower leg: No edema.  Neurological:     Comments: sedated  Psychiatric:        Mood and Affect: Mood normal.     Assessment & Plan:   Septic shock with Multiorgan Failure Asystolic Cardiac Arrest Likely 2/2 to Polysubstance Usage:  Echo back with LVEF 65-70% with normal RV/RA function. Patient in acute renal failure (Cr: 4.3), oliguric on CRRT.  Leukocytosis continues to downtrend 16.7. No fever events ON.  - Continue pressor support with levophed, vasopressin, epi. Titrate for MAP goal >65. - Supportive care. - Continue Bicarb drip - Continue Zosyn.  - Stress Steroids. - Trend CK  Acute hypoxic and hypercapnic respiratory failure In Setting of COVID 19 Mechanically Ventilated  Refractory  Acidosis: Concern for Aspiration given unresponsive state with unknown downtime. Elevated d-dimer, DVT study negative, no acute abnormalities on ECHO. Normal RV function. Continue to  Receive CRRT. Appreciate Nephrology's assistance with [facility] healthcare management. ABG improving pH 7.47, CO2 33, Bicarb 24.5, pO2 114. On fentanyl more responsive on physical examination today, will begin to wean off fentanyl. - CRRT - Continue Full Vent support - Bicarb Drip. - Continue full vent support with LTVV. - Remdesivir contraindicated due to Elevated LFT. - Continue Olumiant 2 mg QD. - Stress steroids Q8H. - Heparin.  - Follow CXR.  Hyperglycemia:  Likely 2/2 to DKA, gap closed x2 with bicarb of 25. Sugars have been 160-175, 5.5U of insulin on endotool.  - Discontinue Endo tool protocol - transition to SQ 20U Lantus - Resistant SSI   RUE Swelling:  Patient with continued RUE swelling and edema.  - [place] today.    Best practice:  Diet: NPO Pain/Anxiety/Delirium protocol (if indicated):  Fentanyl PRN / Midazolam PRN.  RASS goal -1. VAP protocol (if indicated):  In place. DVT prophylaxis: Heparin  GI prophylaxis: Pepcid  Glucose control: SSI  Mobility: Bedrest Code Status: FC Family Communication: Will continue to update.  Disposition: ICU as soon as family visits with pt.   Dolan Amen, MD IMTS, PGY-2 Pager: [phone] 05/19/2020,11:17 AM

## 2020-05-20 ENCOUNTER — Encounter (HOSPITAL_COMMUNITY): Payer: Self-pay

## 2020-05-20 ENCOUNTER — Inpatient Hospital Stay (HOSPITAL_COMMUNITY): Payer: Self-pay

## 2020-05-20 LAB — HEPATIC FUNCTION PANEL
ALT: 2703 U/L — ABNORMAL HIGH (ref 0–44)
AST: 1971 U/L — ABNORMAL HIGH (ref 15–41)
Albumin: 2 g/dL — ABNORMAL LOW (ref 3.5–5.0)
Alkaline Phosphatase: 85 U/L (ref 38–126)
Bilirubin, Direct: 1.8 mg/dL — ABNORMAL HIGH (ref 0.0–0.2)
Indirect Bilirubin: 1.5 mg/dL — ABNORMAL HIGH (ref 0.3–0.9)
Total Bilirubin: 3.3 mg/dL — ABNORMAL HIGH (ref 0.3–1.2)
Total Protein: 4.5 g/dL — ABNORMAL LOW (ref 6.5–8.1)

## 2020-05-20 LAB — RENAL FUNCTION PANEL
Albumin: 2 g/dL — ABNORMAL LOW (ref 3.5–5.0)
Albumin: 2 g/dL — ABNORMAL LOW (ref 3.5–5.0)
Anion gap: 13 (ref 5–15)
Anion gap: 12 (ref 5–15)
BUN: 33 mg/dL — ABNORMAL HIGH (ref 6–20)
BUN: 38 mg/dL — ABNORMAL HIGH (ref 6–20)
CO2: 26 mmol/L (ref 22–32)
CO2: 27 mmol/L (ref 22–32)
Calcium: 7.5 mg/dL — ABNORMAL LOW (ref 8.9–10.3)
Calcium: 7.7 mg/dL — ABNORMAL LOW (ref 8.9–10.3)
Chloride: 98 mmol/L (ref 98–111)
Chloride: 99 mmol/L (ref 98–111)
Creatinine, Ser: 3.95 mg/dL — ABNORMAL HIGH (ref 0.61–1.24)
Creatinine, Ser: 4.06 mg/dL — ABNORMAL HIGH (ref 0.61–1.24)
GFR calc Af Amer: 20 mL/min — ABNORMAL LOW (ref >60)
GFR calc Af Amer: 19 mL/min — ABNORMAL LOW (ref >60)
GFR calc non Af Amer: 17 mL/min — ABNORMAL LOW (ref >60)
GFR calc non Af Amer: 16 mL/min — ABNORMAL LOW (ref >60)
Glucose, Bld: 153 mg/dL — ABNORMAL HIGH (ref 70–99)
Glucose, Bld: 92 mg/dL (ref 70–99)
Phosphorus: 3 mg/dL (ref 2.5–4.6)
Phosphorus: 4.1 mg/dL (ref 2.5–4.6)
Potassium: 5 mmol/L (ref 3.5–5.1)
Potassium: 4.8 mmol/L (ref 3.5–5.1)
Sodium: 137 mmol/L (ref 135–145)
Sodium: 138 mmol/L (ref 135–145)

## 2020-05-20 LAB — CBC WITH DIFFERENTIAL/PLATELET
Abs Immature Granulocytes: 0.1 10*3/uL — ABNORMAL HIGH (ref 0.00–0.07)
Basophils Absolute: 0 10*3/uL (ref 0.0–0.1)
Basophils Relative: 0 %
Eosinophils Absolute: 0 10*3/uL (ref 0.0–0.5)
Eosinophils Relative: 0 %
HCT: 37.2 % — ABNORMAL LOW (ref 39.0–52.0)
Hemoglobin: 12.2 g/dL — ABNORMAL LOW (ref 13.0–17.0)
Immature Granulocytes: 1 %
Lymphocytes Relative: 4 %
Lymphs Abs: 0.6 10*3/uL — ABNORMAL LOW (ref 0.7–4.0)
MCH: 27.5 pg (ref 26.0–34.0)
MCHC: 32.8 g/dL (ref 30.0–36.0)
MCV: 84 fL (ref 80.0–100.0)
Monocytes Absolute: 0.4 10*3/uL (ref 0.1–1.0)
Monocytes Relative: 3 %
Neutro Abs: 13.6 10*3/uL — ABNORMAL HIGH (ref 1.7–7.7)
Neutrophils Relative %: 92 %
Platelets: 82 10*3/uL — ABNORMAL LOW (ref 150–400)
RBC: 4.43 MIL/uL (ref 4.22–5.81)
RDW: 13.3 % (ref 11.5–15.5)
WBC: 14.4 10*3/uL — ABNORMAL HIGH (ref 4.0–10.5)
nRBC: 0.3 % — ABNORMAL HIGH (ref 0.0–0.2)

## 2020-05-20 LAB — GLUCOSE, CAPILLARY
Glucose-Capillary: 143 mg/dL — ABNORMAL HIGH (ref 70–99)
Glucose-Capillary: 128 mg/dL — ABNORMAL HIGH (ref 70–99)
Glucose-Capillary: 135 mg/dL — ABNORMAL HIGH (ref 70–99)
Glucose-Capillary: 112 mg/dL — ABNORMAL HIGH (ref 70–99)
Glucose-Capillary: 95 mg/dL (ref 70–99)
Glucose-Capillary: 75 mg/dL (ref 70–99)
Glucose-Capillary: 87 mg/dL (ref 70–99)
Glucose-Capillary: 86 mg/dL (ref 70–99)

## 2020-05-20 LAB — POCT ACTIVATED CLOTTING TIME
Activated Clotting Time: 213 seconds
Activated Clotting Time: 230 seconds
Activated Clotting Time: 230 seconds
Activated Clotting Time: 235 seconds
Activated Clotting Time: 241 seconds
Activated Clotting Time: 246 seconds
Activated Clotting Time: 246 seconds

## 2020-05-20 LAB — MAGNESIUM
Magnesium: 2.1 mg/dL (ref 1.7–2.4)
Magnesium: 2.1 mg/dL (ref 1.7–2.4)
Magnesium: 2.1 mg/dL (ref 1.7–2.4)

## 2020-05-20 LAB — APTT
aPTT: >200 seconds (ref 24–36)
aPTT: 97 seconds — ABNORMAL HIGH (ref 24–36)
aPTT: 64 seconds — ABNORMAL HIGH (ref 24–36)

## 2020-05-20 LAB — TRIGLYCERIDES: Triglycerides: 97 mg/dL (ref <150)

## 2020-05-20 LAB — PROTIME-INR
INR: 1.2 (ref 0.8–1.2)
Prothrombin Time: 14.9 seconds (ref 11.4–15.2)

## 2020-05-20 LAB — CBC
HCT: 40.6 % (ref 39.0–52.0)
Hemoglobin: 13.7 g/dL (ref 13.0–17.0)
MCH: 28.2 pg (ref 26.0–34.0)
MCHC: 33.7 g/dL (ref 30.0–36.0)
MCV: 83.7 fL (ref 80.0–100.0)
Platelets: 99 10*3/uL — ABNORMAL LOW (ref 150–400)
RBC: 4.85 MIL/uL (ref 4.22–5.81)
RDW: 13.3 % (ref 11.5–15.5)
WBC: 16 10*3/uL — ABNORMAL HIGH (ref 4.0–10.5)
nRBC: 1.1 % — ABNORMAL HIGH (ref 0.0–0.2)

## 2020-05-20 LAB — ABO/RH: ABO/RH(D): O POS

## 2020-05-20 LAB — PHOSPHORUS
Phosphorus: 4.2 mg/dL (ref 2.5–4.6)
Phosphorus: 3.6 mg/dL (ref 2.5–4.6)

## 2020-05-20 LAB — TYPE AND SCREEN
ABO/RH(D): O POS
Antibody Screen: NEGATIVE

## 2020-05-20 LAB — LACTIC ACID, PLASMA: Lactic Acid, Venous: 3.1 mmol/L (ref 0.5–1.9)

## 2020-05-20 MED ORDER — METHYLPREDNISOLONE SODIUM SUCC 125 MG IJ SOLR
1.0000 mg/kg/d | Freq: Two times a day (BID) | INTRAMUSCULAR | Status: DC
  Administered 2020-05-20 – 2020-05-26 (×12): 50 mg via INTRAVENOUS
  Filled 2020-05-20 (×12): qty 2

## 2020-05-20 MED ORDER — PROPOFOL 1000 MG/100ML IV EMUL
0.0000 ug/kg/min | INTRAVENOUS | Status: DC
  Administered 2020-05-20: 10 ug/kg/min via INTRAVENOUS
  Administered 2020-05-20 – 2020-05-22 (×7): 30 ug/kg/min via INTRAVENOUS
  Filled 2020-05-20 (×8): qty 100

## 2020-05-20 MED ORDER — PANTOPRAZOLE SODIUM 40 MG IV SOLR
40.0000 mg | Freq: Two times a day (BID) | INTRAVENOUS | Status: DC
  Administered 2020-05-20 – 2020-05-26 (×12): 40 mg via INTRAVENOUS
  Filled 2020-05-20 (×12): qty 40

## 2020-05-20 MED ORDER — HEPARIN SODIUM (PORCINE) 10000 UNIT/ML IJ SOLN
7500.0000 [IU] | Freq: Three times a day (TID) | INTRAMUSCULAR | Status: DC
  Administered 2020-05-20: 7500 [IU] via SUBCUTANEOUS
  Filled 2020-05-20 (×3): qty 1

## 2020-05-20 MED ORDER — VITAL HIGH PROTEIN PO LIQD
1000.0000 mL | ORAL | Status: DC
  Administered 2020-05-20: 1000 mL

## 2020-05-20 MED ORDER — SODIUM CHLORIDE 0.9 % IV SOLN
2.0000 g | INTRAVENOUS | Status: AC
  Administered 2020-05-20 – 2020-05-23 (×4): 2 g via INTRAVENOUS
  Filled 2020-05-20 (×3): qty 20

## 2020-05-20 MED ORDER — PROSOURCE TF PO LIQD
45.0000 mL | Freq: Two times a day (BID) | ORAL | Status: DC
  Administered 2020-05-20 (×2): 45 mL
  Filled 2020-05-20 (×2): qty 45

## 2020-05-20 NOTE — Progress Notes (Signed)
[facility] Progress Note   49 y.o.malefoundunresponsive in bedby the spouse. Asystole andVT, arrest x2 with known minimum 10 minutes down time. SHock +positive for COVID19. CK was 37K and Troponin 9460 w/ a lactic acid >11 w/ WBC 31.8K, cocaine and ETOH positive. Patient was in florid shock + lactic acidosis and renal failure treated with broad spectrum abx, 3 pressors, steroids, Remdesivir, baricitinib and HCO3. Initial pH of blood gas was 6.8.  Assessment/ Plan:   1. Renal failure - in florid failure from COVID/ sepsis and likelihood of survival is very low. But after d/w CCM will offer RRT with CRRT; will give a trial to see if there is any improvement within a defined time period. Rt fem cath (8/10). Options very limited and from renal standpoint only supportive but will use Oxiris filter to hopefully decrease inflammatory mediators. -Seen on CRRT.No UFstill for now; off  [place] bath for now. Much lower O2 req -> will change to M150 when he goes down for a study today, no further heparin via syringe prefilter  He is on higher dose sq  Heparin  No e/o recovery  2. COVID / septic shock - per CCM. Currently on HCO3 gtt as well and2pressors. 3. Cocaine positive - echo requested already with elevated CK and troponins. 4. S/p cardiac arrest  Subjective:   Off  pressors but eyes deviated and 1 pupil non reactive. O2reqbetter and now down to 40% FIO2  No issues with clotting off filter since adding more heparin but had epistasix overnight -> stopping the heparin via syringe prefilter and still on  Oxiris   Objective:   BP 110/64   Pulse 97   Temp 98.7 F (37.1 C) (Axillary)   Resp (!) 28   Ht 6' (1.829 m)   Wt 99.8 kg   SpO2 98%   BMI 29.84 kg/m   Intake/Output Summary (Last 24 hours) at 05/20/2020 0938 Last data filed at 05/20/2020 0900 Gross per 24 hour  Intake 4939.71 ml  Output 5899 ml  Net -959.29 ml   Weight change: -3.4  kg  Physical Exam: GEN:On vent, responds to pain HEENT:Conjunctival pallor LUNGS: CTA B/Lbut on vent CV: RRR, tachy at times ABD: SNDNTno BS noted SWH:QPRFFMB extremity edema Access: rt femoral catheter  Imaging: No results found.  Labs: BMET Recent Labs  Lab 05/17/20 0725 05/17/20 8466 05/17/20 1742 05/17/20 2153 05/18/20 0420 05/18/20 1130 05/18/20 1515 05/18/20 2027 05/18/20 2252 05/19/20 0330 05/19/20 1122 05/19/20 1600 05/20/20 0416  NA 147*   < > 144   < > 144   < > 139 139 136 138 139 138 137  K 5.0   < > 3.8   < > 4.1   < > 4.0 3.9 3.9 4.2 4.6 4.8 5.0  CL 104   < > 107   < > 107   < > 104 105 100 101 101 100 98  CO2 9*   < > 15*   < > 20*   < > 22 22 23 25 23 26 26   GLUCOSE 383*   < > 453*   < > 159*   < > 186* 189* 184* 185* 178* 147* 153*  BUN 21*   < > 35*   < > 29*   < > 24* 26* 24* 27* 28* 28* 33*  CREATININE 3.64*   < > 4.92*   < > 4.32*   < > 4.06* 3.95* 4.07* 4.28* 4.06* 3.97* 3.95*  CALCIUM 6.4*   < >  7.7*   < > 6.9*   < > 6.9* 6.8* 6.7* 6.6* 7.2* 7.3* 7.5*  PHOS 9.8*  --  4.9*  --  4.8*  4.7*  --  2.6  --   --  2.3*  --  3.4 3.0   < > = values in this interval not displayed.   CBC Recent Labs  Lab 05/17/20 0725 05/17/20 0852 05/18/20 0420 05/18/20 1144 05/19/20 0330 05/20/20 0416  WBC 31.8*  --  20.9*  --  16.7* 16.0*  HGB 16.5   < > 17.2* 16.0 14.7 13.7  HCT 55.3*   < > 50.7 47.0 43.4 40.6  MCV 93.9  --  81.9  --  81.4 83.7  PLT 258  --  192  --  132* 99*   < > = values in this interval not displayed.    Medications:    . baricitinib  2 mg Oral Daily  . chlorhexidine gluconate (MEDLINE KIT)  15 mL Mouth Rinse BID  . Chlorhexidine Gluconate Cloth  6 each Topical Daily  . docusate  100 mg Per Tube BID  . folic acid  1 mg Per Tube Daily  . hydrocortisone sod succinate (SOLU-CORTEF) inj  100 mg Intravenous Q8H  . insulin aspart  0-20 Units Subcutaneous Q4H  . insulin glargine  20 Units Subcutaneous Daily  . lactulose  30 g  Per Tube TID  . mouth rinse  15 mL Mouth Rinse 10 times per day  . pantoprazole sodium  40 mg Per Tube Daily  . sodium chloride flush  10-40 mL Intracatheter Q12H  . thiamine  100 mg Per Tube Daily  . vitamin B-12  100 mcg Per Tube Daily      Otelia Santee, MD 05/20/2020, 9:38 AM

## 2020-05-20 NOTE — Progress Notes (Signed)
NAME:  Stephen Fisher, MRN:  [id number], DOB:  1971/03/02, LOS: 4 ADMISSION DATE:  05/29/2020  History of present illness   49 year old male presents to ED s/p Cardiac Arrest. Per Stephen Fisher patient was drinking last night when they got into an argument and he left the house around midnight. He returned to the house this AM and they continued drinking, he then passed out around lunch time and she rolled him on his side and left to go take a nap. Hours later when she came back he was snoring and she was unable to wake him up. She called EMS. When EMS arrived patient was pulseless in asystole. ROSC after estimated 10 minutes, one episode of V-Tach requiring Defib. ROSC was achieved briefly before loss of pulse again. On arrival patient undergoing CPR. Per EMS was given 3 EPI in transit. ROSC achieved briefly after arrival.  Significant Hospital Events     Consults:  PCCM  Procedures:  ETT 8/10 >> 8/11>> Femoral CVC 8/11>> Femoral a-line  Significant Diagnostic Tests:  CT Head 8/10 > ? PRES CXR 8/10 > ? Upper lobe PNA, presumed 2/2 COVID 8/11 Echo> LVEF 65-70%, moderate LV hypertrophy, RV function normal  Micro Data:    Antimicrobials:  Zosyn 8/10 >8/14 Ceftriaxone 8/14 with planned stop time 8/17  Interim history/subjective:  Remains on CRRT. Bleeding noted from oral cavity this morning.   Objective   Blood pressure 110/64, pulse 95, temperature 98.7 F (37.1 C), temperature source Axillary, resp. rate (!) 28, height 6' (1.829 m), weight 99.8 kg, SpO2 98 %.    Vent Mode: PRVC FiO2 (%):  [40 %-50 %] 40 % Set Rate:  [18 bmp-28 bmp] 28 bmp Vt Set:  [620 mL] 620 mL PEEP:  [5 cmH20-8 cmH20] 5 cmH20 Plateau Pressure:  [22 cmH20-28 cmH20] 28 cmH20   Intake/Output Summary (Last 24 hours) at 05/20/2020 1139 Last data filed at 05/20/2020 1100 Gross per 24 hour  Intake 4638.41 ml  Output 5561 ml  Net -922.59 ml   Filed Weights   05/18/20 0500 05/19/20 0400 05/20/20 0500    Weight: 102.3 kg 103.2 kg 99.8 kg    Examination: General: intubated, sedated HENT: ET tube in place, small amount of blood oozing from left side of mouth Lungs: course breath sounds Cardiovascular: S1-S2 appreciated, no murmurs, tachycardic Abdomen: Bowel sounds appreciated Extremities: No clubbing, no edema Neuro: Sedated, responds to painful stimuli GU: foley in place  Resolved Hospital Problem list     Assessment & Plan:  Shock with multiorgan failure Cardiac arrest likely secondary to polysubstance abuse - Weaned off pressors -Continue CRRT -Continue full ventilator support with ARDS protocol -Hydrocortisone changed to methylprednisone today for covid therapy inplace of stress dose steroids for shock  Acute Hypoxemic Respiratory Failure In setting of cardiac arrest and COVID 19  - Continue ventilator support and wean as tolerated - Continue baricitinib and methylpred  - De-escalate antibiotics to ceftriaxone from zosyn, to complete 7 day course on 8/17  AKI  In setting of cardiac arrest and respiratory failure and rhabdomyolysis - Continue CRRT, stop heparin within circuit for bleeding - stop bicarb drip  Coagulopathy - elevated PTT in setting of anticoagulation - Hold heparin within RRT circuit, resume subc heparin once PTT <100  Hyperglycemia Possibly related to DKA - AG has closed, will transition to subq insulin today  Encephalopathy - CT scan of the head suggestive of press - Scheduled for MRI today  RUE Edema - Check UE doppler  US to rule out DVT - warm compresses  Best practice:  Diet: Tube Feeds Pain/Anxiety/Delirium protocol (if indicated):  Fentanyl PRN / Midazolam PRN.  RASS goal -1. VAP protocol (if indicated):  In place. DVT prophylaxis: Heparin  GI prophylaxis: Pepcid  Glucose control: SSI  Mobility: Bedrest Code Status: FC Family Communication: Will continue to update.  Disposition: ICU  Labs   CBC: Recent Labs  Lab  05/17/20 0725 05/17/20 0852 05/17/20 1355 05/18/20 0420 05/18/20 1144 05/19/20 0330 05/20/20 0416  WBC 31.8*  --   --  20.9*  --  16.7* 16.0*  HGB 16.5   < > 17.3* 17.2* 16.0 14.7 13.7  HCT 55.3*   < > 51.0 50.7 47.0 43.4 40.6  MCV 93.9  --   --  81.9  --  81.4 83.7  PLT 258  --   --  192  --  132* 99*   < > = values in this interval not displayed.    Basic Metabolic Panel: Recent Labs  Lab May 29, 2020 2049 05-29-2020 2341 05/17/20 0725 05/17/20 2595 05/18/20 0420 05/18/20 1130 05/18/20 1515 05/18/20 2027 05/18/20 2252 05/19/20 0330 05/19/20 1122 05/19/20 1600 05/20/20 0416  NA 147*   < > 147*   < > 144   < > 139   < > 136 138 139 138 137  K 7.3*   < > 5.0   < > 4.1   < > 4.0   < > 3.9 4.2 4.6 4.8 5.0  CL 110   < > 104   < > 107   < > 104   < > 100 101 101 100 98  CO2 11*   < > 9*   < > 20*   < > 22   < > 23 25 23 26 26   GLUCOSE 83   < > 383*   < > 159*   < > 186*   < > 184* 185* 178* 147* 153*  BUN 14   < > 21*   < > 29*   < > 24*   < > 24* 27* 28* 28* 33*  CREATININE 2.49*   < > 3.64*   < > 4.32*   < > 4.06*   < > 4.07* 4.28* 4.06* 3.97* 3.95*  CALCIUM 6.4*   < > 6.4*   < > 6.9*   < > 6.9*   < > 6.7* 6.6* 7.2* 7.3* 7.5*  MG 2.8*  --  2.3  --  1.9  --   --   --   --  1.7  --   --  2.1  PHOS >30.0*   < > 9.8*   < > 4.8*  4.7*  --  2.6  --   --  2.3*  --  3.4 3.0   < > = values in this interval not displayed.   GFR: Estimated Creatinine Clearance: 28.3 mL/min (A) (by C-G formula based on SCr of 3.95 mg/dL (H)). Recent Labs  Lab 05/29/2020 2049 May 29, 2020 2209 05/17/20 0725 05/17/20 1422 05/18/20 0420 05/19/20 0330 05/19/20 1107 05/19/20 1726 05/20/20 0416  PROCALCITON 3.24  --  28.15  --  40.63  --   --   --   --   WBC  --   --  31.8*  --  20.9* 16.7*  --   --  16.0*  LATICACIDVEN  --    < >  --  >11.0* 5.6*  --  3.9* 4.3*  --    < > =  values in this interval not displayed.    Liver Function Tests: Recent Labs  Lab 05/15/2020 1913 06/06/2020 1913 05/08/2020 2049  05/15/2020 2049 05/17/20 1422 05/17/20 1742 05/18/20 0420 05/18/20 1515 05/19/20 0330 05/19/20 1600 05/20/20 0416  AST 5,312*  --  10,943*  --  >10,000*  --  >10,000*  --   --   --   --   ALT 5,405*  --  8,756*  --  9,163*  --  7,807*  --   --   --   --   ALKPHOS 76  --  98  --  113  --  128*  --   --   --   --   BILITOT 0.9  --  1.0  --  1.4*  --  1.7*  --   --   --   --   PROT 4.8*  --  4.9*  --  4.5*  --  4.7*  --   --   --   --   ALBUMIN 2.7*   < > 2.6*   < > 2.2*   < > 2.2*  2.2* 2.2* 1.9* 2.2* 2.0*   < > = values in this interval not displayed.   No results for input(s): LIPASE, AMYLASE in the last 168 hours. Recent Labs  Lab 05/13/2020 2210  AMMONIA 159*    ABG    Component Value Date/Time   PHART 7.476 (H) 05/18/2020 1144   PCO2ART 33.3 05/18/2020 1144   PO2ART 114 (H) 05/18/2020 1144   HCO3 24.5 05/18/2020 1144   TCO2 25 05/18/2020 1144   ACIDBASEDEF 15.0 (H) 05/17/2020 1355   O2SAT 99.0 05/18/2020 1144     Coagulation Profile: Recent Labs  Lab 05/22/2020 1913  INR 1.9*    Cardiac Enzymes: Recent Labs  Lab 05/17/20 0725 05/19/20 1122  CKTOTAL 36,803* 37,988*    HbA1C: Hgb A1c MFr Bld  Date/Time Value Ref Range Status  05/17/2020 02:22 PM 5.5 4.8 - 5.6 % Final    Comment:    (NOTE) Pre diabetes:          5.7%-6.4%  Diabetes:              >6.4%  Glycemic control for   <7.0% adults with diabetes     CBG: Recent Labs  Lab 05/19/20 1946 05/19/20 2321 05/20/20 0409 05/20/20 0759 05/20/20 0919  GLUCAP 123* 130* 143* 128* 135*     Critical care time: 40 minutes    The patient is critically ill with multiple organ system failure and requires high complexity decision making for assessment and support, frequent evaluation and titration of therapies, advanced monitoring, review of radiographic studies and interpretation of complex data.   Critical Care Time devoted to patient care services, exclusive of separately billable procedures,  described in this note is 40 minutes.   Melody Comas, MD [facility] Office: [phone]   See Amion for Pager Details

## 2020-05-20 NOTE — Progress Notes (Addendum)
Notified Francine Graven, MD about pt. Having new sources of blood in urine, rectum, and IV sites. MD ordered for CBC, PT/ INR, APTT, and type and screen to be collected. Also for sub q heparin to be discontinued at this time.    RN will continue to monitor pt. At this time

## 2020-05-21 ENCOUNTER — Inpatient Hospital Stay (HOSPITAL_COMMUNITY): Payer: HRSA Program

## 2020-05-21 DIAGNOSIS — Z7189 Other specified counseling: Secondary | ICD-10-CM

## 2020-05-21 DIAGNOSIS — M7989 Other specified soft tissue disorders: Secondary | ICD-10-CM

## 2020-05-21 DIAGNOSIS — Z515 Encounter for palliative care: Secondary | ICD-10-CM

## 2020-05-21 DIAGNOSIS — U071 COVID-19: Secondary | ICD-10-CM

## 2020-05-21 LAB — RENAL FUNCTION PANEL
Albumin: 1.9 g/dL — ABNORMAL LOW (ref 3.5–5.0)
Albumin: 2 g/dL — ABNORMAL LOW (ref 3.5–5.0)
Anion gap: 13 (ref 5–15)
Anion gap: 12 (ref 5–15)
BUN: 52 mg/dL — ABNORMAL HIGH (ref 6–20)
BUN: 59 mg/dL — ABNORMAL HIGH (ref 6–20)
CO2: 24 mmol/L (ref 22–32)
CO2: 23 mmol/L (ref 22–32)
Calcium: 7.8 mg/dL — ABNORMAL LOW (ref 8.9–10.3)
Calcium: 7.9 mg/dL — ABNORMAL LOW (ref 8.9–10.3)
Chloride: 101 mmol/L (ref 98–111)
Chloride: 103 mmol/L (ref 98–111)
Creatinine, Ser: 3.86 mg/dL — ABNORMAL HIGH (ref 0.61–1.24)
Creatinine, Ser: 3.68 mg/dL — ABNORMAL HIGH (ref 0.61–1.24)
GFR calc Af Amer: 20 mL/min — ABNORMAL LOW
GFR calc Af Amer: 21 mL/min — ABNORMAL LOW (ref >60)
GFR calc non Af Amer: 17 mL/min — ABNORMAL LOW
GFR calc non Af Amer: 18 mL/min — ABNORMAL LOW (ref >60)
Glucose, Bld: 143 mg/dL — ABNORMAL HIGH (ref 70–99)
Glucose, Bld: 154 mg/dL — ABNORMAL HIGH (ref 70–99)
Phosphorus: 4 mg/dL (ref 2.5–4.6)
Phosphorus: 3.5 mg/dL (ref 2.5–4.6)
Potassium: 4.9 mmol/L (ref 3.5–5.1)
Potassium: 4.8 mmol/L (ref 3.5–5.1)
Sodium: 138 mmol/L (ref 135–145)
Sodium: 138 mmol/L (ref 135–145)

## 2020-05-21 LAB — CBC
HCT: 35.8 % — ABNORMAL LOW (ref 39.0–52.0)
Hemoglobin: 11.7 g/dL — ABNORMAL LOW (ref 13.0–17.0)
MCH: 27.7 pg (ref 26.0–34.0)
MCHC: 32.7 g/dL (ref 30.0–36.0)
MCV: 84.8 fL (ref 80.0–100.0)
Platelets: 78 10*3/uL — ABNORMAL LOW (ref 150–400)
RBC: 4.22 MIL/uL (ref 4.22–5.81)
RDW: 13.5 % (ref 11.5–15.5)
WBC: 12.8 10*3/uL — ABNORMAL HIGH (ref 4.0–10.5)
nRBC: 0.2 % (ref 0.0–0.2)

## 2020-05-21 LAB — MAGNESIUM: Magnesium: 2.6 mg/dL — ABNORMAL HIGH (ref 1.7–2.4)

## 2020-05-21 LAB — GLUCOSE, CAPILLARY
Glucose-Capillary: 113 mg/dL — ABNORMAL HIGH (ref 70–99)
Glucose-Capillary: 122 mg/dL — ABNORMAL HIGH (ref 70–99)
Glucose-Capillary: 125 mg/dL — ABNORMAL HIGH (ref 70–99)
Glucose-Capillary: 136 mg/dL — ABNORMAL HIGH (ref 70–99)
Glucose-Capillary: 131 mg/dL — ABNORMAL HIGH (ref 70–99)

## 2020-05-21 LAB — CULTURE, BLOOD (ROUTINE X 2)
Culture: NO GROWTH
Special Requests: ADEQUATE

## 2020-05-21 LAB — TRIGLYCERIDES: Triglycerides: 279 mg/dL — ABNORMAL HIGH (ref <150)

## 2020-05-21 LAB — APTT: aPTT: 42 seconds — ABNORMAL HIGH (ref 24–36)

## 2020-05-21 MED ORDER — SODIUM CHLORIDE 0.9 % IV SOLN
250.0000 mL | INTRAVENOUS | Status: DC | PRN
  Administered 2020-05-22: 250 mL via INTRAVENOUS

## 2020-05-21 MED ORDER — SODIUM CHLORIDE 0.9% FLUSH: 3.0000 mL | INTRAVENOUS | Status: DC | PRN

## 2020-05-21 MED ORDER — INSULIN GLARGINE 100 UNIT/ML ~~LOC~~ SOLN
15.0000 [IU] | Freq: Every day | SUBCUTANEOUS | Status: DC
  Administered 2020-05-22 – 2020-05-24 (×3): 15 [IU] via SUBCUTANEOUS
  Filled 2020-05-21 (×3): qty 0.15

## 2020-05-21 MED ORDER — VITAL HIGH PROTEIN PO LIQD
1000.0000 mL | ORAL | Status: DC
  Administered 2020-05-23 – 2020-05-26 (×4): 1000 mL
  Filled 2020-05-21 (×3): qty 1000

## 2020-05-21 MED ORDER — SODIUM CHLORIDE 0.9% FLUSH
3.0000 mL | Freq: Two times a day (BID) | INTRAVENOUS | Status: DC
  Administered 2020-05-21 – 2020-05-26 (×6): 3 mL via INTRAVENOUS

## 2020-05-21 MED ORDER — PROSOURCE TF PO LIQD
45.0000 mL | Freq: Three times a day (TID) | ORAL | Status: DC
  Administered 2020-05-21 – 2020-05-26 (×17): 45 mL
  Filled 2020-05-21 (×16): qty 45

## 2020-05-21 NOTE — Progress Notes (Signed)
Clinical Update: Spoke with patient's daughters, son and fiance about his poor prognosis given the results of the MRI with severe diffuse anoxic brain injury. We had a long discussion and I answered all of their questions at this time. They did request that palliative care reach out to them for any other possible needs. They have decided to make the patient DNR/DNI at this time. They will discuss together about the next steps in his care.  Melody Comas, MD [facility] Office: [phone]   See Amion for Pager Details

## 2020-05-21 NOTE — Progress Notes (Signed)
[facility] Progress Note   1. Renal failure - in florid failure from COVID/ sepsis and likelihood of survival is very low. But after d/w CCM will offer RRT with CRRT; will give a trial to see if there is any improvement within a defined time period. Rt fem cath (8/10). Options very limited and from renal standpoint only supportive but will use Oxiris filter to hopefully decrease inflammatory mediators. -Seen on CRRT.No UFstill for now; off  Martin bath for now. Much lower O2 req -> will change to M150 when he goes down for a study today, no further heparin via syringe prefilter  He is on higher dose sq  Heparin  No e/o recovery  2. COVID / septic shock - per CCM. Currently on HCO3 gtt as well and2pressors. 3. Cocaine positive - echo requested already with elevated CK and troponins. 4. S/p cardiac arrest  Assessment/ Plan:   1. Renal failure - in florid failure from COVID/ sepsis and likelihood of survival is very low. But after d/w CCM will offer RRT with CRRT; will give a trial to see if there is any improvement within a defined time period. Rt fem cath (8/10). Options very limited and from renal standpoint only supportive but will use Oxiris filter to hopefully decrease inflammatory mediators. -Seen on CRRT.Tolerating 50 ml net UF. Continue 4K bath for now. Much lower O2 req -> changed to M150,  no further heparin via syringe prefilter (epistaxis w/ prefilter heparin syringe on CRRT + higher dose sq heparin)  Will stop CRRT with next filter change and evaluate for any e/o renal recovery.  He is on higher dose sq  Heparin   2. COVID / septic shock - per CCM. Currently on HCO3 gtt as well and2pressors. 3. Cocaine positive - echo requested already with elevated CK and troponins. 4. S/p cardiac arrest  Subjective:   Off  pressors but eyes deviated and 1 pupil non reactive. O2reqbetter and now down to30% FIO2  No issues with clotting off filter  since adding more heparin SQ but had epistasix prior night.    Objective:   BP 118/60   Pulse 81   Temp 98.9 F (37.2 C) (Axillary)   Resp (!) 28   Ht 6' (1.829 m)   Wt 99.2 kg   SpO2 100%   BMI 29.66 kg/m   Intake/Output Summary (Last 24 hours) at 05/21/2020 0804 Last data filed at 05/21/2020 0700 Gross per 24 hour  Intake 2096.52 ml  Output 3378 ml  Net -1281.48 ml   Weight change: -0.6 kg  Physical Exam: GEN:On vent HEENT:Conjunctival pallor LUNGS: CTA B/Lbut on vent CV: RRR ABD: SNDNTno BS noted EXT:No lower extremity edema, rt arm swollen Access:rtfemoral catheter  Imaging: MR BRAIN WO CONTRAST  Result Date: 05/20/2020 CLINICAL DATA:  Cardiac arrest EXAM: MRI HEAD WITHOUT CONTRAST TECHNIQUE: Multiplanar, multiecho pulse sequences of the brain and surrounding structures were obtained without intravenous contrast. COMPARISON:  None. FINDINGS: Brain: There is diffuse cortical reduced diffusion. There is also involvement of the basal ganglia, hippocampus, and cerebellum. There is corresponding T2 hyperintensity. No significant mass effect. No hydrocephalus. There is no evidence of hemorrhage. No extra-axial fluid collection. Vascular: Major vessel flow voids at the skull base are preserved. Skull and upper cervical spine: Normal marrow signal is preserved. Sinuses/Orbits: Nonspecific paranasal sinus mucosal thickening. Orbits are unremarkable. Other: Sella is unremarkable. Nonspecific mastoid fluid opacification. IMPRESSION: Severe hypoxic/ischemic injury. Electronically Signed   By: Macy Mis M.D.   On:  05/20/2020 13:45    Labs: BMET Recent Labs  Lab 05/18/20 1515 05/18/20 2252 05/19/20 0330 05/19/20 1122 05/19/20 1600 05/20/20 0416 05/20/20 1359 05/20/20 1508 05/20/20 1718 05/21/20 0416  NA  --  136 138 139 138 137  --  138  --  138  K  --  3.9 4.2 4.6 4.8 5.0  --  4.8  --  4.9  CL  --  100 101 101 100 98  --  99  --  101  CO2  --  _0 --  27  --  24  GLUCOSE  --  184* 185* 178* 147* 153*  --  92  --  143*  BUN  --  24* 27* 28* 28* 33*  --  38*  --  52*  CREATININE  --  4.07* 4.28* 4.06* 3.97* 3.95*  --  4.06*  --  3.86*  CALCIUM  --  6.7* 6.6* 7.2* 7.3* 7.5*  --  7.7*  --  7.8*  PHOS   < >  --  2.3*  --  3.4 3.0 4.2 4.1 3.6 4.0   < > = values in this interval not displayed.   CBC Recent Labs  Lab 05/19/20 0330 05/20/20 0416 05/20/20 1830 05/21/20 0416  WBC 16.7* 16.0* 14.4* 12.8*  NEUTROABS  --   --  13.6*  --   HGB 14.7 13.7 12.2* 11.7*  HCT 43.4 40.6 37.2* 35.8*  MCV 81.4 83.7 84.0 84.8  PLT 132* 99* 82* 78*    Medications:    . baricitinib  2 mg Oral Daily  . chlorhexidine gluconate (MEDLINE KIT)  15 mL Mouth Rinse BID  . Chlorhexidine Gluconate Cloth  6 each Topical Daily  . docusate  100 mg Per Tube BID  . feeding supplement (PROSource TF)  45 mL Per Tube BID  . feeding supplement (VITAL HIGH PROTEIN)  1,000 mL Per Tube Q24H  . folic acid  1 mg Per Tube Daily  . insulin aspart  0-20 Units Subcutaneous Q4H  . insulin glargine  20 Units Subcutaneous Daily  . lactulose  30 g Per Tube TID  . mouth rinse  15 mL Mouth Rinse 10 times per day  . methylPREDNISolone (SOLU-MEDROL) injection  1 mg/kg/day Intravenous Q12H  . pantoprazole (PROTONIX) IV  40 mg Intravenous Q12H  . sodium chloride flush  10-40 mL Intracatheter Q12H  . thiamine  100 mg Per Tube Daily  . vitamin B-12  100 mcg Per Tube Daily      Otelia Santee, MD 05/21/2020, 8:04 AM

## 2020-05-21 NOTE — Progress Notes (Signed)
VASCULAR LAB    Right upper extremity venous duplex completed.    Preliminary report:  See CV proc for preliminary results.  Morgyn Marut, RVT 05/21/2020, 11:50 AM

## 2020-05-21 NOTE — Consult Note (Addendum)
Consultation Note Date: 05/21/2020   Patient Name: Stephen Fisher  DOB: 17-Jan-1971  MRN: [id number]  Age / Sex: 49 y.o., male  PCP: Patient, No Pcp Per Referring Physician: Tomma Lightning, MD  Reason for Consultation: Establishing goals of care  HPI/Patient Profile: 49 y.o. male  with unknown past medical history presents to the [facility] emergency department on 06/06/2020 status post cardiac arrest. Per fiancee, patient was drinking last night when they got into an argument and he left the house around midnight. He returned home the next morning, they continued drinking and then he passed out around lunch time. When she came back hours later, he was snoring and she was unable to wake him up so she called EMS. Upon EMS arrival, patient was pulseless in asystole. ROSC after estimated 10 minutes, one episode of V-tach requiring defibrillation. ROSC was achieved briefly before loss of pulse again. Per EMS, 3 rounds epi given in route. CPR ongoing on arrival to ED. ROSC achieved briefly after arrival.  In the ED, labs significant for ABG 6.831/74/420/15. AST/ALT 5312/5405. Troponin 953, UDS +Cocaine, ETOH Level 123. Started on levophed gtt. Also positive for covid-19.  MRI brain 8/14 showing severe hypoxic/ischemic injury. Palliative care has been consulted to assist with goals of care.   Clinical Assessment and Goals of Care: I have reviewed medical records including EPIC notes, labs and imaging, and received reports from the the attending MD and bedside RN. I spoke with son Lyndon Code, daughter Marcella Dubs, and daughter Judi Saa to discuss diagnosis, prognosis, GOC, EOL wishes, disposition, and options.  I introduced Palliative Medicine as specialized medical care for people living with serious illness. It focuses on providing relief from the symptoms and stress of a serious illness.   Social history: Patient is originally  from [place]. He has a fiancee that he has been with for 3-4 years. He also has 3 adult children from a previous relationship. His children reports they have a close relationship with his fiancee. His children share with me that he was in prison for about 10 years, released 2-3 years ago. Since his release, he has been "really healthy" as far as they know, working out every day. They share with me that he loves music and traveling. As far as functional and nutritional status, there were no known deficits prior to admission.   I asked the patients family what they knew and understood about patient's present health situation. The family vocalized that they did not completely understand the situation. A formal description of his present health state was reviewed. Emphasis was given to the fact that patient's body had not received oxygen/blood flow for an unknown amount of time, and he is now in multi-system organ failure. Also emphasized that he has severe brain injury from lack of oxygen, and that this is not reversible.  Allowed for questions from family members, they asked what would happen if the ventilatory support was stopped.  We discussed that removal of ventilator support does not cause someone to pass, it simply allows  for natural death that would have occurred earlier due to her lung disease. Discussed that medication would be given to promote comfort and prevent pain or dyspnea. Marquel states he wants patient to "have a chance to fight". I emphasized that patient will unfortunately pass despite continued aggressive care, and that removing support would be a compassionate decision.    I acknowledged to the family that this is a very tragic situation and a difficult decision. Provided reassurance that there is not pressure from the medical team to stop support at this time and that there is understanding they need some time to process this information. Discussed that patient however may pass in the  meantime. Discussed the importance of continued conversation with family and the medical providers regarding overall plan of care and treatment options, ensuring decisions are within the context of the patients values and GOCs.  Questions and concerns were addressed.  The family provided with PMT contact number and encouraged to call with questions or concerns. I also emailed an electronic copy of the "Hard Choices" book to [facility].   Primary decision maker: majority of patient's adult children   SUMMARY OF RECOMMENDATIONS   - DNR (no CPR or ACSL in the event of cardiac arrest) - family requesting time to process - palliative medicine will continue to follow  Code Status/Advance Care Planning:  DNR  Symptom Management:   Per primary team  Palliative Prophylaxis:   Aspiration, Oral Care and Turn Reposition  Psycho-social/Spiritual:   Desire for further Chaplaincy support: yes-Christian faith  Prognosis:   Poor in the setting of multi-system organ failure and severe anoxic brain injury  Discharge Planning: unable to determine      Primary Diagnoses: Present on Admission: . Cardiac arrest (HCC)   I have reviewed the medical record, interviewed the patient and family, and examined the patient. The following aspects are pertinent.  No past medical history on file.  Physical Exam Constitutional:      Comments: Critically ill appearing On CRRT  Pulmonary:     Comments: Intubated, full ventilator support Neurological:     Comments: No purposeful movement Myoclonic jerking     Vital Signs: BP (!) 141/78   Pulse 78   Temp 98.9 F (37.2 C) (Axillary)   Resp (!) 31   Ht 6' (1.829 m)   Wt 99.2 kg   SpO2 96%   BMI 29.66 kg/m  Pain Scale: CPOT    SpO2: SpO2: 96 % O2 Device:SpO2: 96 % O2 Flow Rate: .   Palliative Assessment/Data: 10%    Time In: 4:30 Time Out: 5:20 Time Total: 50 minutes Greater than 50%  of this time was spent counseling and  coordinating care related to the above assessment and plan.  Signed by: Merry Proud, NP   Please contact Palliative Medicine Team phone at [phone] for questions and concerns.  For individual provider: See Loretha Stapler

## 2020-05-21 NOTE — Progress Notes (Signed)
Nutrition Follow-up  DOCUMENTATION CODES:   Not applicable  INTERVENTION:   Increase Vital High Protein to 60 ml/hr via OGT (1440 ml daily)  45 ml Prosource TF TID.    Tube feeding regimen provides 1560 kcal (100% of needs), 159 grams of protein, and 1204 ml of H2O.  TF plus current rate of propofol provides 2051 kcals (100% of estimated kcal needs).    NUTRITION DIAGNOSIS:   Increased nutrient needs related to catabolic illness (COVID+) as evidenced by estimated needs.  Ongoing  GOAL:   Patient will meet greater than or equal to 90% of their needs  Progressing   MONITOR:   Vent status, I & O's  REASON FOR ASSESSMENT:   Consult Assessment of nutrition requirement/status  ASSESSMENT:   Pt with PMH of ETOH and cocaine abuse admitted after cardiac arrest, persistent shock, lactic acidosis with multiorgan failure who is COVID+.  8/13- CRRT initiated 8/14- TF initiated  Patient remains intubated on ventilator support MV: 17.6 L/min Temp (24hrs), Avg:98.8 F (37.1 C), Min:97.9 F (36.6 C), Max:99.1 F (37.3 C)  Propofol: 18.58 ml/hr (proivdes 491 kcals daily at current rate)  Reviewed I/O's: -1.3 L x 24 hours and +6.3 L since admission  UOP: 24 ml x 24 hours  Rectal tube output: 400 ml x 24 hours  Pt receiving TF via OGT: 1040 kcals (1531 kcals with current propofol rate), 106 grams protein, and 806 ml water daily, meeting 100% of estimated kcal needs and 71% of estimated protein needs.   Medications reviewed and include colace, folic acid, lactulose, IV solu-medrol, vitamin B-12, fentanyl, and levophed.   Labs reviewed: Mg: 2.6, CBGS: 86-113 (inpatient orders for glycemic control are 0-20 units isuslin aspart every 4 hours and 20 units insulin aspart daily).   Diet Order:   Diet Order            Diet NPO time specified  Diet effective now                 EDUCATION NEEDS:   No education needs have been identified at this time  Skin:  Skin  Assessment: Reviewed RN Assessment  Last BM:  05/21/20 (via rectal tube)  Height:   Ht Readings from Last 1 Encounters:  May 17, 2020 6' (1.829 m)    Weight:   Wt Readings from Last 1 Encounters:  05/21/20 99.2 kg    Ideal Body Weight:  80.9 kg  BMI:  Body mass index is 29.66 kg/m.  Estimated Nutritional Needs:   Kcal:  6433-2951  Protein:  149-198 grams  Fluid:  > 1.5 L    Levada Schilling, RD, LDN, CDCES Registered Dietitian II Certified Diabetes Care and Education Specialist Please refer to [facility] for RD and/or RD on-call/weekend/after hours pager

## 2020-05-21 NOTE — Progress Notes (Signed)
NAME:  Stephen Fisher, MRN:  [id number], DOB:  Jul 20, 1971, LOS: 5 ADMISSION DATE:  June 05, 2020  History of present illness   49 year old male presents to ED s/p Cardiac Arrest. Per [name] patient was drinking last night when they got into an argument and he left the house around midnight. He returned to the house this AM and they continued drinking, he then passed out around lunch time and she rolled him on his side and left to go take a nap. Hours later when she came back he was snoring and she was unable to wake him up. She called EMS. When EMS arrived patient was pulseless in asystole. ROSC after estimated 10 minutes, one episode of V-Tach requiring Defib. ROSC was achieved briefly before loss of pulse again. On arrival patient undergoing CPR. Per EMS was given 3 EPI in transit. ROSC achieved briefly after arrival.  Significant Hospital Events     Consults:  PCCM  Procedures:  ETT 8/10 >> 8/11>> Femoral CVC 8/11>> Femoral a-line  Significant Diagnostic Tests:  CT Head 8/10 > ? PRES CXR 8/10 > ? Upper lobe PNA, presumed 2/2 COVID 8/11 Echo> LVEF 65-70%, moderate LV hypertrophy, RV function normal 8/14 MRI Brain> severe diffuse anoxic brain injury noted  Micro Data:  8/12 MRSA PCR negative 8/10 Blood Cx negative  Antimicrobials:  Zosyn 8/10 >8/14 Ceftriaxone 8/14 with planned stop time 8/17  Interim history/subjective:  Remains on CRRT. Bleeding noted from oral cavity and in stools yesterday. Heparin PPX held overnight.  Objective   Blood pressure 118/60, pulse 75, temperature 98.9 F (37.2 C), temperature source Axillary, resp. rate (!) 28, height 6' (1.829 m), weight 99.2 kg, SpO2 99 %.    Vent Mode: PRVC FiO2 (%):  [30 %] 30 % Set Rate:  [28 bmp] 28 bmp Vt Set:  [620 mL] 620 mL PEEP:  [5 cmH20] 5 cmH20 Pressure Support:  [10 cmH20] 10 cmH20 Plateau Pressure:  [28 cmH20-33 cmH20] 28 cmH20   Intake/Output Summary (Last 24 hours) at 05/21/2020 0932 Last data  filed at 05/21/2020 0900 Gross per 24 hour  Intake 2271.99 ml  Output 3483 ml  Net -1211.01 ml   Filed Weights   05/19/20 0400 05/20/20 0500 05/21/20 0500  Weight: 103.2 kg 99.8 kg 99.2 kg    Examination: General: intubated, sedated HENT: ET tube in place, small amount of blood oozing from left side of mouth Lungs: course breath sounds Cardiovascular: S1-S2 appreciated, no murmurs, tachycardic Abdomen: Bowel sounds appreciated Extremities: No clubbing, no edema Neuro: Sedated, responds to painful stimuli GU: foley in place  Resolved Hospital Problem list     Assessment & Plan:  Shock with multiorgan failure Cardiac arrest likely secondary to polysubstance abuse - Weaned off pressors -Continue CRRT -Continue full ventilator support with ARDS protocol -Hydrocortisone changed to methylprednisone on 8/14 for covid therapy inplace of stress dose steroids for shock  Acute Hypoxemic Respiratory Failure In setting of cardiac arrest and COVID 19  - Continue ventilator support and wean as tolerated - Continue baricitinib and methylpred  - De-escalated antibiotics to ceftriaxone on 8/14 from zosyn, to complete 7 day course on 8/17  AKI  In setting of cardiac arrest and respiratory failure and rhabdomyolysis - Continue CRRT, stop heparin within circuit for bleeding  Coagulopathy - elevated PTT in setting of anticoagulation - Hold heparin within RRT circuit and subc heparin for now - started PPI BID on 8/14 for blood noted in OG tube  Hyperglycemia - continue SSI -  reduce lantus to 15 units from 20 today  Encephalopathy - Secondary to severe anoxic brain injury noted on MRI 8/14  RUE Edema - Check UE doppler US to rule out DVT, ordered on 8/14 - warm compresses  Disposition: Family updated about MRI results yesterday and poor prognosis. Will have family call with fiance, son and daughters today. Discussed case with palliative care yesterday and will request formal consult  if family having difficult time proceeding with situation.  Best practice:  Diet: Tube Feeds Pain/Anxiety/Delirium protocol (if indicated):  Fentanyl PRN / Midazolam PRN.  RASS goal -1. VAP protocol (if indicated):  In place. DVT prophylaxis: Heparin (on hold due to bleeding)  GI prophylaxis: Pepcid  Glucose control: SSI  Mobility: Bedrest Code Status: FC Family Communication: Will continue to update.  Disposition: ICU  Labs   CBC: Recent Labs  Lab 05/18/20 0420 05/18/20 0420 05/18/20 1144 05/19/20 0330 05/20/20 0416 05/20/20 1830 05/21/20 0416  WBC 20.9*  --   --  16.7* 16.0* 14.4* 12.8*  NEUTROABS  --   --   --   --   --  13.6*  --   HGB 17.2*   < > 16.0 14.7 13.7 12.2* 11.7*  HCT 50.7   < > 47.0 43.4 40.6 37.2* 35.8*  MCV 81.9  --   --  81.4 83.7 84.0 84.8  PLT 192  --   --  132* 99* 82* 78*   < > = values in this interval not displayed.    Basic Metabolic Panel: Recent Labs  Lab 05/19/20 0330 05/19/20 0330 05/19/20 1122 05/19/20 1600 05/19/20 1600 05/20/20 0416 05/20/20 1359 05/20/20 1508 05/20/20 1718 05/21/20 0416  NA 138   < > 139 138  --  137  --  138  --  138  K 4.2   < > 4.6 4.8  --  5.0  --  4.8  --  4.9  CL 101   < > 101 100  --  98  --  99  --  101  CO2 25   < > 23 26  --  26  --  27  --  24  GLUCOSE 185*   < > 178* 147*  --  153*  --  92  --  143*  BUN 27*   < > 28* 28*  --  33*  --  38*  --  52*  CREATININE 4.28*   < > 4.06* 3.97*  --  3.95*  --  4.06*  --  3.86*  CALCIUM 6.6*   < > 7.2* 7.3*  --  7.5*  --  7.7*  --  7.8*  MG 1.7  --   --   --   --  2.1 2.1  --  2.1 2.6*  PHOS 2.3*   < >  --  3.4   < > 3.0 4.2 4.1 3.6 4.0   < > = values in this interval not displayed.   GFR: Estimated Creatinine Clearance: 28.8 mL/min (A) (by C-G formula based on SCr of 3.86 mg/dL (H)). Recent Labs  Lab 2020/06/08 2049 08-Jun-2020 2209 05/17/20 0725 05/17/20 1422 05/18/20 0420 05/18/20 0420 05/19/20 0330 05/19/20 1107 05/19/20 1726 05/20/20 0416  05/20/20 1359 05/20/20 1830 05/21/20 0416  PROCALCITON 3.24  --  28.15  --  40.63  --   --   --   --   --   --   --   --   WBC  --    < >  31.8*  --  20.9*   < > 16.7*  --   --  16.0*  --  14.4* 12.8*  LATICACIDVEN  --    < >  --    < > 5.6*  --   --  3.9* 4.3*  --  3.1*  --   --    < > = values in this interval not displayed.    Liver Function Tests: Recent Labs  Lab 05-17-2020 1913 05/17/2020 1913 2020/05/17 2049 2020-05-17 2049 05/17/20 1422 05/17/20 1742 05/18/20 0420 05/18/20 1515 05/19/20 0330 05/19/20 1600 05/20/20 0416 05/20/20 1508 05/21/20 0416  AST 5,312*  --  10,943*  --  >10,000*  --  >10,000*  --   --   --   --  1,971*  --   ALT 5,405*  --  8,756*  --  9,163*  --  7,807*  --   --   --   --  2,703*  --   ALKPHOS 76  --  98  --  113  --  128*  --   --   --   --  85  --   BILITOT 0.9  --  1.0  --  1.4*  --  1.7*  --   --   --   --  3.3*  --   PROT 4.8*  --  4.9*  --  4.5*  --  4.7*  --   --   --   --  4.5*  --   ALBUMIN 2.7*   < > 2.6*   < > 2.2*   < > 2.2*  2.2*   < > 1.9* 2.2* 2.0* 2.0*  2.0* 1.9*   < > = values in this interval not displayed.   No results for input(s): LIPASE, AMYLASE in the last 168 hours. Recent Labs  Lab 05-17-20 2210  AMMONIA 159*    ABG    Component Value Date/Time   PHART 7.476 (H) 05/18/2020 1144   PCO2ART 33.3 05/18/2020 1144   PO2ART 114 (H) 05/18/2020 1144   HCO3 24.5 05/18/2020 1144   TCO2 25 05/18/2020 1144   ACIDBASEDEF 15.0 (H) 05/17/2020 1355   O2SAT 99.0 05/18/2020 1144     Coagulation Profile: Recent Labs  Lab 05-17-20 1913 05/20/20 1830  INR 1.9* 1.2    Cardiac Enzymes: Recent Labs  Lab 05/17/20 0725 05/19/20 1122  CKTOTAL 36,803* 37,988*    HbA1C: Hgb A1c MFr Bld  Date/Time Value Ref Range Status  05/17/2020 02:22 PM 5.5 4.8 - 5.6 % Final    Comment:    (NOTE) Pre diabetes:          5.7%-6.4%  Diabetes:              >6.4%  Glycemic control for   <7.0% adults with diabetes     CBG: Recent  Labs  Lab 05/20/20 1620 05/20/20 1936 05/20/20 2314 05/21/20 0402 05/21/20 0822  GLUCAP 75 87 86 113* 122*     Critical care time: 40 minutes    The patient is critically ill with multiple organ system failure and requires high complexity decision making for assessment and support, frequent evaluation and titration of therapies, advanced monitoring, review of radiographic studies and interpretation of complex data.   Critical Care Time devoted to patient care services, exclusive of separately billable procedures, described in this note is 35 minutes.   Melody Comas, MD [facility] Office: [phone]   See Amion for Pager Details

## 2020-05-22 ENCOUNTER — Other Ambulatory Visit: Payer: Self-pay

## 2020-05-22 DIAGNOSIS — J9601 Acute respiratory failure with hypoxia: Secondary | ICD-10-CM

## 2020-05-22 DIAGNOSIS — Z515 Encounter for palliative care: Secondary | ICD-10-CM

## 2020-05-22 DIAGNOSIS — G931 Anoxic brain damage, not elsewhere classified: Secondary | ICD-10-CM

## 2020-05-22 DIAGNOSIS — Z7189 Other specified counseling: Secondary | ICD-10-CM

## 2020-05-22 DIAGNOSIS — J9600 Acute respiratory failure, unspecified whether with hypoxia or hypercapnia: Secondary | ICD-10-CM

## 2020-05-22 DIAGNOSIS — F10929 Alcohol use, unspecified with intoxication, unspecified: Secondary | ICD-10-CM

## 2020-05-22 LAB — CULTURE, BLOOD (ROUTINE X 2)
Culture: NO GROWTH
Special Requests: ADEQUATE

## 2020-05-22 LAB — GLUCOSE, CAPILLARY
Glucose-Capillary: 135 mg/dL — ABNORMAL HIGH (ref 70–99)
Glucose-Capillary: 138 mg/dL — ABNORMAL HIGH (ref 70–99)
Glucose-Capillary: 147 mg/dL — ABNORMAL HIGH (ref 70–99)
Glucose-Capillary: 153 mg/dL — ABNORMAL HIGH (ref 70–99)
Glucose-Capillary: 159 mg/dL — ABNORMAL HIGH (ref 70–99)
Glucose-Capillary: 168 mg/dL — ABNORMAL HIGH (ref 70–99)
Glucose-Capillary: 168 mg/dL — ABNORMAL HIGH (ref 70–99)

## 2020-05-22 LAB — CBC
HCT: 35.7 % — ABNORMAL LOW (ref 39.0–52.0)
Hemoglobin: 12 g/dL — ABNORMAL LOW (ref 13.0–17.0)
MCH: 28.6 pg (ref 26.0–34.0)
MCHC: 33.6 g/dL (ref 30.0–36.0)
MCV: 85.2 fL (ref 80.0–100.0)
Platelets: 87 10*3/uL — ABNORMAL LOW (ref 150–400)
RBC: 4.19 MIL/uL — ABNORMAL LOW (ref 4.22–5.81)
RDW: 13.6 % (ref 11.5–15.5)
WBC: 10.1 10*3/uL (ref 4.0–10.5)
nRBC: 0.4 % — ABNORMAL HIGH (ref 0.0–0.2)

## 2020-05-22 LAB — RENAL FUNCTION PANEL
Albumin: 2 g/dL — ABNORMAL LOW (ref 3.5–5.0)
Albumin: 2.1 g/dL — ABNORMAL LOW (ref 3.5–5.0)
Anion gap: 14 (ref 5–15)
Anion gap: 12 (ref 5–15)
BUN: 70 mg/dL — ABNORMAL HIGH (ref 6–20)
BUN: 68 mg/dL — ABNORMAL HIGH (ref 6–20)
CO2: 22 mmol/L (ref 22–32)
CO2: 21 mmol/L — ABNORMAL LOW (ref 22–32)
Calcium: 7.9 mg/dL — ABNORMAL LOW (ref 8.9–10.3)
Calcium: 8 mg/dL — ABNORMAL LOW (ref 8.9–10.3)
Chloride: 101 mmol/L (ref 98–111)
Chloride: 104 mmol/L (ref 98–111)
Creatinine, Ser: 3.62 mg/dL — ABNORMAL HIGH (ref 0.61–1.24)
Creatinine, Ser: 3.5 mg/dL — ABNORMAL HIGH (ref 0.61–1.24)
GFR calc Af Amer: 22 mL/min — ABNORMAL LOW (ref >60)
GFR calc Af Amer: 23 mL/min — ABNORMAL LOW (ref >60)
GFR calc non Af Amer: 19 mL/min — ABNORMAL LOW (ref >60)
GFR calc non Af Amer: 20 mL/min — ABNORMAL LOW (ref >60)
Glucose, Bld: 156 mg/dL — ABNORMAL HIGH (ref 70–99)
Glucose, Bld: 160 mg/dL — ABNORMAL HIGH (ref 70–99)
Phosphorus: 3.4 mg/dL (ref 2.5–4.6)
Phosphorus: 4 mg/dL (ref 2.5–4.6)
Potassium: 4.9 mmol/L (ref 3.5–5.1)
Potassium: 5.2 mmol/L — ABNORMAL HIGH (ref 3.5–5.1)
Sodium: 137 mmol/L (ref 135–145)
Sodium: 137 mmol/L (ref 135–145)

## 2020-05-22 LAB — CK: Total CK: 5748 U/L — ABNORMAL HIGH (ref 49–397)

## 2020-05-22 LAB — MAGNESIUM: Magnesium: 2.7 mg/dL — ABNORMAL HIGH (ref 1.7–2.4)

## 2020-05-22 LAB — APTT: aPTT: 29 seconds (ref 24–36)

## 2020-05-22 LAB — TRIGLYCERIDES: Triglycerides: 532 mg/dL — ABNORMAL HIGH (ref <150)

## 2020-05-22 MED ORDER — MIDAZOLAM 50MG/50ML (1MG/ML) PREMIX INFUSION
0.5000 mg/h | INTRAVENOUS | Status: DC
  Administered 2020-05-22: 6 mg/h via INTRAVENOUS
  Administered 2020-05-22: 2 mg/h via INTRAVENOUS
  Administered 2020-05-22 – 2020-05-23 (×2): 6 mg/h via INTRAVENOUS
  Administered 2020-05-24: 4 mg/h via INTRAVENOUS
  Filled 2020-05-22 (×5): qty 50

## 2020-05-22 NOTE — Progress Notes (Signed)
Maroon colored stool reported to Dr Kendrick Fries

## 2020-05-22 NOTE — Progress Notes (Signed)
K+ 5.2 reported to Dr Kathrene Bongo .

## 2020-05-22 NOTE — Progress Notes (Signed)
[facility] Progress Note   49 y.o.malefoundunresponsive in bedby the spouse. Asystole andVT, arrest x2 with known minimum 10 minutes down time. SHock +positive for COVID19. CK was 37K and Troponin 9460 w/ a lactic acid >11 w/ WBC 31.8K, cocaine and ETOH positive. Patient was in florid shock + lactic acidosis and renal failure treated with broad spectrum abx, 3 pressors, steroids, Remdesivir, baricitinib and HCO3. Initial pH of blood gas was 6.8.  Assessment/ Plan:   1. Acute renal failure-secondary to Covid/sepsis.  Likelihood of survival very low.  Kidney function unlikely to improve unless overall clinical status changes.  No signs of renal recovery at this time with minimal to no urine output.  -Seen on CRRT.Tolerating 150 ml net UF. Continue 4K bath for now. -No clotting at this time -continue high-dose heparin -Consider stopping with next filter change to see if urine output will increase   2. COVID / septic shock -shock state improved but continuing to require ventilatory support 3. Cocaine positive -complicated by rhabdomyolysis. 4. S/p cardiac arrest: Severe illness as above  Subjective:   No noted bleeding or clotting.  Tolerating CRRT.  No urine output.  No pressor requirement.   Objective:   BP 136/69   Pulse 89   Temp 97.8 F (36.6 C) (Axillary)   Resp (!) 29   Ht 6' (1.829 m)   Wt 97 kg   SpO2 98%   BMI 29.00 kg/m   Intake/Output Summary (Last 24 hours) at 05/22/2020 1238 Last data filed at 05/22/2020 1200 Gross per 24 hour  Intake 2998.33 ml  Output 4062 ml  Net -1063.67 ml   Weight change: -2.2 kg  Physical Exam: GEN:On vent, sedated HEENT:No nasal discharge, dry mucous membranes LUNGS: Bilateral chest rise, ventilated CV: Normal rate ABD: SNDNTminimal bowel sounds EXT:No lower extremity edema, rt arm swollen Access:rtfemoral catheter  Imaging: MR BRAIN WO CONTRAST  Result Date: 05/20/2020 CLINICAL DATA:  Cardiac  arrest EXAM: MRI HEAD WITHOUT CONTRAST TECHNIQUE: Multiplanar, multiecho pulse sequences of the brain and surrounding structures were obtained without intravenous contrast. COMPARISON:  None. FINDINGS: Brain: There is diffuse cortical reduced diffusion. There is also involvement of the basal ganglia, hippocampus, and cerebellum. There is corresponding T2 hyperintensity. No significant mass effect. No hydrocephalus. There is no evidence of hemorrhage. No extra-axial fluid collection. Vascular: Major vessel flow voids at the skull base are preserved. Skull and upper cervical spine: Normal marrow signal is preserved. Sinuses/Orbits: Nonspecific paranasal sinus mucosal thickening. Orbits are unremarkable. Other: Sella is unremarkable. Nonspecific mastoid fluid opacification. IMPRESSION: Severe hypoxic/ischemic injury. Electronically Signed   By: Macy Mis M.D.   On: 05/20/2020 13:45   VAS [place] UPPER EXTREMITY VENOUS DUPLEX  Result Date: 05/21/2020 UPPER VENOUS STUDY  Indications: Swelling, and Covid-19 Limitations: Line, bandages and significant edema. Comparison Study: No prior study Performing Technologist: Sharion Dove RVS  Examination Guidelines: A complete evaluation includes B-mode imaging, spectral Doppler, color Doppler, and power Doppler as needed of all accessible portions of each vessel. Bilateral testing is considered an integral part of a complete examination. Limited examinations for reoccurring indications may be performed as noted.  Right Findings: +----------+------------+---------+-----------+----------+---------------+ RIGHT     CompressiblePhasicitySpontaneousProperties    Summary     +----------+------------+---------+-----------+----------+---------------+ IJV  Not visualized  +----------+------------+---------+-----------+----------+---------------+ Subclavian               Yes       Yes                               +----------+------------+---------+-----------+----------+---------------+ Axillary      Full       Yes       Yes                              +----------+------------+---------+-----------+----------+---------------+ Brachial      Full       Yes       Yes                              +----------+------------+---------+-----------+----------+---------------+ Radial                                              patent by color +----------+------------+---------+-----------+----------+---------------+ Ulnar                                               Not visualized  +----------+------------+---------+-----------+----------+---------------+ Cephalic                                            Not visualized  +----------+------------+---------+-----------+----------+---------------+ Basilic                                             Not visualized  +----------+------------+---------+-----------+----------+---------------+  Left Findings: +----------+------------+---------+-----------+----------+--------------+ LEFT      CompressiblePhasicitySpontaneousProperties   Summary     +----------+------------+---------+-----------+----------+--------------+ Subclavian                                          Not visualized +----------+------------+---------+-----------+----------+--------------+  Summary:  Right: No obvious evidence of DVT noted in the visualized veins of the right upper extremity. Unable to visualize the IJV, cephalic, basilic, and ulnar veins not visualized. Significant interstitial fluid noted throughout the upper arm.  *See table(s) above for measurements and observations.  Diagnosing physician: Ruta Hinds MD Electronically signed by Ruta Hinds MD on 05/21/2020 at 12:27:37 PM.    Final     Labs: BMET Recent Labs  Lab 05/19/20 0330 05/19/20 1122 05/19/20 1600 05/19/20 1600 05/20/20 0416 05/20/20 1359 05/20/20 1508 05/20/20 1718  05/21/20 0416 05/21/20 1540 05/22/20 0455  NA  --  139 138  --  137  --  138  --  138 138 137  K  --  4.6 4.8  --  5.0  --  4.8  --  4.9 4.8 4.9  CL  --  101 100  --  98  --  99  --  101 103 101  CO2  --  23 26  --  26  --  27  --  24 23 22   GLUCOSE  --  178* 147*  --  153*  --  92  --  143* 154* 156*  BUN  --  28* 28*  --  33*  --  38*  --  52* 59* 70*  CREATININE  --  4.06* 3.97*  --  3.95*  --  4.06*  --  3.86* 3.68* 3.62*  CALCIUM  --  7.2* 7.3*  --  7.5*  --  7.7*  --  7.8* 7.9* 7.9*  PHOS   < >  --  3.4   < > 3.0 4.2 4.1 3.6 4.0 3.5 3.4   < > = values in this interval not displayed.   CBC Recent Labs  Lab 05/20/20 0416 05/20/20 1830 05/21/20 0416 05/22/20 0455  WBC 16.0* 14.4* 12.8* 10.1  NEUTROABS  --  13.6*  --   --   HGB 13.7 12.2* 11.7* 12.0*  HCT 40.6 37.2* 35.8* 35.7*  MCV 83.7 84.0 84.8 85.2  PLT 99* 82* 78* 87*    Medications:    . baricitinib  2 mg Oral Daily  . chlorhexidine gluconate (MEDLINE KIT)  15 mL Mouth Rinse BID  . Chlorhexidine Gluconate Cloth  6 each Topical Daily  . docusate  100 mg Per Tube BID  . feeding supplement (PROSource TF)  45 mL Per Tube TID  . folic acid  1 mg Per Tube Daily  . insulin aspart  0-20 Units Subcutaneous Q4H  . insulin glargine  15 Units Subcutaneous Daily  . lactulose  30 g Per Tube TID  . mouth rinse  15 mL Mouth Rinse 10 times per day  . methylPREDNISolone (SOLU-MEDROL) injection  1 mg/kg/day Intravenous Q12H  . pantoprazole (PROTONIX) IV  40 mg Intravenous Q12H  . sodium chloride flush  10-40 mL Intracatheter Q12H  . sodium chloride flush  3 mL Intravenous Q12H  . thiamine  100 mg Per Tube Daily  . vitamin B-12  100 mcg Per Tube Daily      Reesa Chew  05/22/2020, 12:38 PM

## 2020-05-22 NOTE — Progress Notes (Signed)
Assisted family with E-Link tele visit. 

## 2020-05-22 NOTE — Progress Notes (Signed)
Daily Progress Note   Patient Name: Stephen Fisher       Date: 05/22/2020 DOB: 06/23/1971  Age: 49 y.o. MRN#: [id number] Attending Physician: Juanito Doom, MD Primary Care Physician: Patient, No Pcp Per Admit Date: 05/24/2020  Reason for Consultation/Follow-up: continued GOC discussion in the setting of complex medical decision making  Subjective: Per PCCM and nursing, no major events overnight. Patient remains on full vent support, CRRT, minimal neuro response with myoclonic jerking. I spoke with Sanjuana Letters, and Lindsey by phone and provided an update on patient's condition. I reviewed our discussion from yesterday--that patient's heart stopped, so his body and brain had not received oxygen/blood flow for an unknown amount of time resulting in shock multi-system organ failure. Also discussed that he has severe brain injury from lack of oxygen, and that this is not reversible. Discussed that even if patient survives the acute phase of this, he will remain with significant neurologic deficits, likely ventilator dependent and with poor quality of life. Discussed that the current medical interventions are causing discomfort to his body and prolonging his present state.  Odis Hollingshead states that she completely understands everything I'm saying, but does not think her brother and sister are understanding how serious this is. We discuss perhaps it would be helpful if they could see him in his current state. Discussed they cannot physically see him due to he is COVID positive, but we can arrange for a virtual visit.   Later, I speak with Shymia only. She states that her brother and sister are not being realistic about the situation. She shares with me that the reason is because they lost their  mother a few years ago to sarcoidosis, and her siblings are afraid to lose their remaining parent. I offered emotional support, and reassurance that the medical team is not pressuring them to make a decision. The goal is to provide them with the clear and honest picture of his clinical state so that they can make an informed decision.    Length of Stay: 6  Current Medications: Scheduled Meds:  . baricitinib  2 mg Oral Daily  . chlorhexidine gluconate (MEDLINE KIT)  15 mL Mouth Rinse BID  . Chlorhexidine Gluconate Cloth  6 each Topical Daily  . docusate  100 mg Per Tube BID  . feeding supplement (PROSource  TF)  45 mL Per Tube TID  . folic acid  1 mg Per Tube Daily  . insulin aspart  0-20 Units Subcutaneous Q4H  . insulin glargine  15 Units Subcutaneous Daily  . lactulose  30 g Per Tube TID  . mouth rinse  15 mL Mouth Rinse 10 times per day  . methylPREDNISolone (SOLU-MEDROL) injection  1 mg/kg/day Intravenous Q12H  . pantoprazole (PROTONIX) IV  40 mg Intravenous Q12H  . sodium chloride flush  10-40 mL Intracatheter Q12H  . sodium chloride flush  3 mL Intravenous Q12H  . thiamine  100 mg Per Tube Daily  . vitamin B-12  100 mcg Per Tube Daily    Continuous Infusions: .  prismasol BGK 4/2.5 500 mL/hr at 05/22/20 1347  .  prismasol BGK 4/2.5 300 mL/hr at 05/21/20 2211  . sodium chloride 10 mL/hr at 05/21/20 0800  . sodium chloride 10 mL/hr at 05/22/20 1400  . cefTRIAXone (ROCEPHIN)  IV 2 g (05/21/20 1600)  . epinephrine Stopped (05/17/20 2246)  . feeding supplement (VITAL HIGH PROTEIN) 60 mL/hr at 05/22/20 1300  . fentaNYL infusion INTRAVENOUS 200 mcg/hr (05/22/20 1400)  . midazolam 6 mg/hr (05/22/20 1400)  . prismasol BGK 4/2.5 1,500 mL/hr at 05/22/20 1124    PRN Meds: Place/Maintain arterial line **AND** sodium chloride, sodium chloride, fentaNYL (SUBLIMAZE) injection, heparin, heparin, midazolam, polyethylene glycol, sodium chloride, sodium chloride flush, sodium chloride flush         Vital Signs: BP (!) 144/76   Pulse 88   Temp 97.8 F (36.6 C) (Axillary)   Resp (!) 29   Ht 6' (1.829 m)   Wt 97 kg   SpO2 100%   BMI 29.00 kg/m  SpO2: SpO2: 100 % O2 Device: O2 Device: Ventilator O2 Flow Rate:      Palliative Assessment/Data: 10%      Patient Active Problem List   Diagnosis Date Noted  . Acute respiratory failure ([place])   . Alcoholic intoxication with complication ([place])   . Anoxic brain injury ([place])   . Goals of care, counseling/discussion   . Palliative care by specialist   . Acidosis   . AKI (acute kidney injury) ([place])   . Cocaine abuse ([place])   . COVID-19   . Respiratory failure ([place])   . Cardiac arrest [facility]) 05/18/2020    Palliative Care Assessment & Plan   HPI/Patient Profile: 49 y.o. male  with unknown past medical history presents to the [facility] emergency department on 05/20/2020 status post cardiac arrest. Per fiancee, patient was drinking last night when they got into an argument and he left the house around midnight. He returned home the next morning, they continued drinking and then he passed out around lunch time. When she came back hours later, he was snoring and she was unable to wake him up so she called EMS. Upon EMS arrival, patient was pulseless in asystole. ROSC after estimated 10 minutes, one episode of V-tach requiring defibrillation. ROSC was achieved briefly before loss of pulse again. Per EMS, 3 rounds epi given in route. CPR ongoing on arrival to ED. ROSC achieved briefly after arrival.  In the ED, labs significant for ABG 6.831/74/420/15. AST/ALT 5312/5405. Troponin 953, UDS +Cocaine, ETOH Level 123.Started on levophed gtt. Also positive for covid-19.  MRI brain 8/14 showing severe hypoxic/ischemic injury. Palliative care has been consulted to assist with goals of care.   Assessment: - shock with multiorgan failure, shock resolved - acute hypoxemic respiratory failure, remains on full vent support -  AKI, requiring CRRT -  coagulopathy - severe anoxic brain injury  Recommendations/Plan: - DNR - arrange for virtual visit for family via e-link - I will follow-up with family tomorrow  Code Status:  Prognosis:  Poor in the setting of multi-system organ failure and severe anoxic brain injury  Discharge Planning:  To Be Determined  Care plan was discussed with PCCM, decide RN  Thank you for allowing the Palliative Medicine Team to assist in the care of this patient.   Total Time 35  Prolonged Time Billed  no       Greater than 50%  of this time was spent counseling and coordinating care related to the above assessment and plan.  Lavena Bullion, NP  Please contact Palliative Medicine Team phone at [phone] for questions and concerns.

## 2020-05-22 NOTE — Progress Notes (Signed)
NAME:  Stephen Fisher, MRN:  [id number], DOB:  16-Sep-1971, LOS: 6 ADMISSION DATE:  05/07/2020  Brief history  49 y/o male had an out of hospital cardiac arrest, at least 10 minutes.  Has evidence of anoxic brain injury on MRI brain and exam.  COVID postive.  Significant Hospital Events   8/15 discussion with family re severe anoxic brain injury on MRI, decision made to change code status to DNR, palliative medicine consulted  Consults:  PCCM palliative  Procedures:  ETT 8/10 >> 8/11>> Femoral CVC 8/11>> Femoral a-line  Significant Diagnostic Tests:  CT Head 8/10 > ? PRES CXR 8/10 > ? Upper lobe PNA, presumed 2/2 COVID 8/11 Echo> LVEF 65-70%, moderate LV hypertrophy, RV function normal 8/14 MRI Brain> severe diffuse anoxic brain injury noted  Micro Data:  8/12 MRSA PCR negative 8/10 Blood Cx negative  Antimicrobials/COVID Rx  Zosyn 8/10 >8/14 Ceftriaxone 8/14 with planned stop time 8/17  8/11 baricitinib >   Interim history/subjective:  Made DNR yesterday after discussion with palliative medicine   Objective   Blood pressure (!) 150/85, pulse 88, temperature 98 F (36.7 C), temperature source Axillary, resp. rate (!) 29, height 6' (1.829 m), weight 97 kg, SpO2 96 %.    Vent Mode: PRVC FiO2 (%):  [30 %] 30 % Set Rate:  [28 bmp] 28 bmp Vt Set:  [620 mL] 620 mL PEEP:  [5 cmH20] 5 cmH20 Plateau Pressure:  [19 cmH20-28 cmH20] 19 cmH20   Intake/Output Summary (Last 24 hours) at 05/22/2020 0932 Last data filed at 05/22/2020 3419 Gross per 24 hour  Intake 2934.91 ml  Output 3804 ml  Net -869.09 ml   Filed Weights   05/20/20 0500 05/21/20 0500 05/22/20 0500  Weight: 99.8 kg 99.2 kg 97 kg    Examination:  General:  In bed on vent HENT: NCAT ETT in place PULM: CTA B, vent supported breathing CV: RRR, no mgr GI: BS+, soft, nontender MSK: normal bulk and tone Neuro: periodic myoclonic jerks noted. No motor response to voice or painful stimuli, no eye  opening   Resolved Hospital Problem list     Assessment & Plan:  Shock with multiorgan failure> shock resolved Monitor hemodynamics  Acute Hypoxemic Respiratory Failure Full mechanical vent support VAP prevention Hold SBT plans, needs one way extubation  AKI  CRRT per renal> if filter clots would not restart until goals of care are clarified Monitor BMET and UOP Replace electrolytes as needed  Coagulopathy Monitor for bleeding Transfuse PRBC for Hgb < 7 gm/dL   Hyperglycemia SSI, lantus Monitor   Severe anoxic brain injury  Myoclonic jerking Start versed infusion for comfort/myoclonus Continuing dilaudid for vent synchrony per PAD protocol   Disposition: will discuss goals of care with palliative medicine today.  Would favor withdrawal of care  Best practice:  Diet: Tube Feeds Pain/Anxiety/Delirium protocol (if indicated):  PAD protocol.  RASS goal -1. VAP protocol (if indicated):  In place. DVT prophylaxis: Heparin (on hold due to bleeding)  GI prophylaxis: Pepcid  Glucose control: SSI  Mobility: Bedrest Code Status: FC Family Communication: as above Disposition: ICU  Labs   CBC: Recent Labs  Lab 05/19/20 0330 05/20/20 0416 05/20/20 1830 05/21/20 0416 05/22/20 0455  WBC 16.7* 16.0* 14.4* 12.8* 10.1  NEUTROABS  --   --  13.6*  --   --   HGB 14.7 13.7 12.2* 11.7* 12.0*  HCT 43.4 40.6 37.2* 35.8* 35.7*  MCV 81.4 83.7 84.0 84.8 85.2  PLT 132* 99*  82* 78* 87*    Basic Metabolic Panel: Recent Labs  Lab 05/20/20 0416 05/20/20 0416 05/20/20 1359 05/20/20 1359 05/20/20 1508 05/20/20 1718 05/21/20 0416 05/21/20 1540 05/22/20 0455  NA 137  --   --   --  138  --  138 138 137  K 5.0  --   --   --  4.8  --  4.9 4.8 4.9  CL 98  --   --   --  99  --  101 103 101  CO2 26  --   --   --  27  --  24 23 22   GLUCOSE 153*  --   --   --  92  --  143* 154* 156*  BUN 33*  --   --   --  38*  --  52* 59* 70*  CREATININE 3.95*  --   --   --  4.06*  --  3.86*  3.68* 3.62*  CALCIUM 7.5*  --   --   --  7.7*  --  7.8* 7.9* 7.9*  MG 2.1  --  2.1  --   --  2.1 2.6*  --  2.7*  PHOS 3.0   < > 4.2   < > 4.1 3.6 4.0 3.5 3.4   < > = values in this interval not displayed.   GFR: Estimated Creatinine Clearance: 30.5 mL/min (A) (by C-G formula based on SCr of 3.62 mg/dL (H)). Recent Labs  Lab May 29, 2020 2049 2020/05/29 2209 05/17/20 0725 05/17/20 1422 05/18/20 0420 05/19/20 0330 05/19/20 1107 05/19/20 1726 05/20/20 0416 05/20/20 1359 05/20/20 1830 05/21/20 0416 05/22/20 0455  PROCALCITON 3.24  --  28.15  --  40.63  --   --   --   --   --   --   --   --   WBC  --    < > 31.8*  --  20.9*   < >  --   --  16.0*  --  14.4* 12.8* 10.1  LATICACIDVEN  --    < >  --    < > 5.6*  --  3.9* 4.3*  --  3.1*  --   --   --    < > = values in this interval not displayed.    Liver Function Tests: Recent Labs  Lab May 29, 2020 1913 05/29/20 1913 May 29, 2020 2049 May 29, 2020 2049 05/17/20 1422 05/17/20 1742 05/18/20 0420 05/18/20 1515 05/20/20 0416 05/20/20 1508 05/21/20 0416 05/21/20 1540 05/22/20 0455  AST 5,312*  --  10,943*  --  >10,000*  --  >10,000*  --   --  1,971*  --   --   --   ALT 5,405*  --  8,756*  --  9,163*  --  7,807*  --   --  2,703*  --   --   --   ALKPHOS 76  --  98  --  113  --  128*  --   --  85  --   --   --   BILITOT 0.9  --  1.0  --  1.4*  --  1.7*  --   --  3.3*  --   --   --   PROT 4.8*  --  4.9*  --  4.5*  --  4.7*  --   --  4.5*  --   --   --   ALBUMIN 2.7*   < > 2.6*   < > 2.2*   < >  2.2*  2.2*   < > 2.0* 2.0*  2.0* 1.9* 2.0* 2.0*   < > = values in this interval not displayed.   No results for input(s): LIPASE, AMYLASE in the last 168 hours. Recent Labs  Lab 06/05/2020 2210  AMMONIA 159*    ABG    Component Value Date/Time   PHART 7.476 (H) 05/18/2020 1144   PCO2ART 33.3 05/18/2020 1144   PO2ART 114 (H) 05/18/2020 1144   HCO3 24.5 05/18/2020 1144   TCO2 25 05/18/2020 1144   ACIDBASEDEF 15.0 (H) 05/17/2020 1355   O2SAT  99.0 05/18/2020 1144     Coagulation Profile: Recent Labs  Lab 05/12/2020 1913 05/20/20 1830  INR 1.9* 1.2    Cardiac Enzymes: Recent Labs  Lab 05/17/20 0725 05/19/20 1122 05/22/20 0455  CKTOTAL 36,803* 37,988* 5,748*    HbA1C: Hgb A1c MFr Bld  Date/Time Value Ref Range Status  05/17/2020 02:22 PM 5.5 4.8 - 5.6 % Final    Comment:    (NOTE) Pre diabetes:          5.7%-6.4%  Diabetes:              >6.4%  Glycemic control for   <7.0% adults with diabetes     CBG: Recent Labs  Lab 05/21/20 1525 05/21/20 2032 05/22/20 0018 05/22/20 0349 05/22/20 0758  GLUCAP 136* 131* 138* 168* 135*     Critical care time: 30 minutes    The patient is critically ill with multiple organ system failure and requires high complexity decision making for assessment and support, frequent evaluation and titration of therapies, advanced monitoring, review of radiographic studies and interpretation of complex data.    Heber Attala, MD [place] [facility] Pager: [phone] Cell: [phone] If no response, call [phone]    See Amion for Pager Details

## 2020-05-23 DIAGNOSIS — F10929 Alcohol use, unspecified with intoxication, unspecified: Secondary | ICD-10-CM

## 2020-05-23 LAB — GLUCOSE, CAPILLARY
Glucose-Capillary: 142 mg/dL — ABNORMAL HIGH (ref 70–99)
Glucose-Capillary: 188 mg/dL — ABNORMAL HIGH (ref 70–99)
Glucose-Capillary: 171 mg/dL — ABNORMAL HIGH (ref 70–99)
Glucose-Capillary: 195 mg/dL — ABNORMAL HIGH (ref 70–99)
Glucose-Capillary: 192 mg/dL — ABNORMAL HIGH (ref 70–99)
Glucose-Capillary: 161 mg/dL — ABNORMAL HIGH (ref 70–99)

## 2020-05-23 LAB — RENAL FUNCTION PANEL
Albumin: 2.1 g/dL — ABNORMAL LOW (ref 3.5–5.0)
Albumin: 1.9 g/dL — ABNORMAL LOW (ref 3.5–5.0)
Anion gap: 14 (ref 5–15)
Anion gap: 15 (ref 5–15)
BUN: 74 mg/dL — ABNORMAL HIGH (ref 6–20)
BUN: 81 mg/dL — ABNORMAL HIGH (ref 6–20)
CO2: 20 mmol/L — ABNORMAL LOW (ref 22–32)
CO2: 18 mmol/L — ABNORMAL LOW (ref 22–32)
Calcium: 8.1 mg/dL — ABNORMAL LOW (ref 8.9–10.3)
Calcium: 7.8 mg/dL — ABNORMAL LOW (ref 8.9–10.3)
Chloride: 100 mmol/L (ref 98–111)
Chloride: 104 mmol/L (ref 98–111)
Creatinine, Ser: 3.51 mg/dL — ABNORMAL HIGH (ref 0.61–1.24)
Creatinine, Ser: 3.33 mg/dL — ABNORMAL HIGH (ref 0.61–1.24)
GFR calc Af Amer: 23 mL/min — ABNORMAL LOW (ref >60)
GFR calc Af Amer: 24 mL/min — ABNORMAL LOW (ref >60)
GFR calc non Af Amer: 20 mL/min — ABNORMAL LOW (ref >60)
GFR calc non Af Amer: 21 mL/min — ABNORMAL LOW (ref >60)
Glucose, Bld: 199 mg/dL — ABNORMAL HIGH (ref 70–99)
Glucose, Bld: 199 mg/dL — ABNORMAL HIGH (ref 70–99)
Phosphorus: 4.3 mg/dL (ref 2.5–4.6)
Phosphorus: 4.4 mg/dL (ref 2.5–4.6)
Potassium: 5.3 mmol/L — ABNORMAL HIGH (ref 3.5–5.1)
Potassium: 5.5 mmol/L — ABNORMAL HIGH (ref 3.5–5.1)
Sodium: 134 mmol/L — ABNORMAL LOW (ref 135–145)
Sodium: 137 mmol/L (ref 135–145)

## 2020-05-23 LAB — CBC
HCT: 38.3 % — ABNORMAL LOW (ref 39.0–52.0)
Hemoglobin: 12.8 g/dL — ABNORMAL LOW (ref 13.0–17.0)
MCH: 28.3 pg (ref 26.0–34.0)
MCHC: 33.4 g/dL (ref 30.0–36.0)
MCV: 84.5 fL (ref 80.0–100.0)
Platelets: 85 10*3/uL — ABNORMAL LOW (ref 150–400)
RBC: 4.53 MIL/uL (ref 4.22–5.81)
RDW: 13.5 % (ref 11.5–15.5)
WBC: 9.8 10*3/uL (ref 4.0–10.5)
nRBC: 0.2 % (ref 0.0–0.2)

## 2020-05-23 LAB — APTT: aPTT: 27 seconds (ref 24–36)

## 2020-05-23 LAB — MAGNESIUM: Magnesium: 2.8 mg/dL — ABNORMAL HIGH (ref 1.7–2.4)

## 2020-05-23 LAB — HEPARIN INDUCED PLATELET AB (HIT ANTIBODY): Heparin Induced Plt Ab: 0.077 OD (ref 0.000–0.400)

## 2020-05-23 MED ORDER — SODIUM ZIRCONIUM CYCLOSILICATE 10 G PO PACK: 10.0000 g | PACK | Freq: Once | ORAL | Status: DC

## 2020-05-23 NOTE — Progress Notes (Signed)
Daily Progress Note   Patient Name: Stephen Fisher       Date: 05/23/2020 DOB: Feb 07, 1971  Age: 49 y.o. MRN#: [id number] Attending Physician: Lupita Leash, MD Primary Care Physician: Patient, No Pcp Per Admit Date: 05/21/2020  Reason for Consultation/Follow-up: continued GOC discussion  Subjective: I spoke with family again by phone. The share with me they had a virtual visit yesterday evening.  Discussed again that even if patient survives the acute phase of this, he will remain with significant neurologic deficits due to severe anoxic brain injury. We discussed in more detail what that would look like. Discussed he would not be able to breath or eat on his own, communicate or have any meaningful interactions, and would be dependent on others for all aspects of care.  We discussed that he would need a tracheostomy and would be ventilator dependent. He would also be dependent on artificial feeding and would need a permanent feeding tube. He would require long-term care at a special facility. Discussed possible complications from prolonged immobility including infections and skin break down, which will occur over time even with the best of care.   During the conversation, family began to have disagreement among themselves. Judi Saa states again that she doesn't want him to suffer. Lyndon Code is agreeing with [facility], stating he doesn't want to see him like this. [name] (youngest daughter, 45 years old) states she wants to "give him a chance to fight". She expresses anger and hangs up from the conference call. Judi Saa expresses to me "You need to talk to her, I understand." Marquel tells Judi Saa "that's your little sister, you need to talk to her".  I gently tell Shymia and Marquel that per [place] law,  majority of adult children are allowed to make medical decisions. However, they are not willing to override [name] at this point.   Length of Stay: 7  Vital Signs: BP 138/77   Pulse 85   Temp 98 F (36.7 C) (Axillary)   Resp (!) 28   Ht 6' (1.829 m)   Wt 93.6 kg   SpO2 100%   BMI 27.99 kg/m  SpO2: SpO2: 100 % O2 Device: O2 Device: Ventilator O2 Flow Rate:    Intake/output summary:   Intake/Output Summary (Last 24 hours) at 05/23/2020 1234 Last data filed at 05/23/2020 1200 Gross  per 24 hour  Intake 2836.73 ml  Output 4585 ml  Net -1748.27 ml   LBM: Last BM Date: 05/23/20 Baseline Weight: Weight: 99.8 kg Most recent weight: Weight: 93.6 kg       Palliative Assessment/Data: 10%      Patient Active Problem List   Diagnosis Date Noted  . Acute respiratory failure (HCC)   . Alcoholic intoxication with complication (HCC)   . Anoxic brain injury (HCC)   . Goals of care, counseling/discussion   . Palliative care by specialist   . Acidosis   . AKI (acute kidney injury) (HCC)   . Cocaine abuse (HCC)   . COVID-19   . Respiratory failure (HCC)   . Cardiac arrest [facility]) 06/12/20    Palliative Care Assessment & Plan   HPI/Patient Profile:49 y.o.malewith unknown past medical history [facility]06[facility] post cardiac arrest.Per fiancee, patient was drinking last night when they got into an argument and he left the house around midnight. He returned home the next morning, they continued drinking and then he passed out around lunch time. When she came back hours later, he was snoring and she was unable to wake him up so she called EMS. Upon EMS arrival, patient was pulseless in asystole. ROSC after estimated 10 minutes, one episode of V-tach requiring defibrillation. ROSC was achieved briefly before loss of pulse again. Per EMS, 3 rounds epi given in route. CPR ongoing on arrival to ED. ROSC achieved briefly after arrival.  In the ED,  labs significant forABG 6.831/74/420/15. AST/ALT 5312/5405. Troponin 953,UDS +Cocaine,ETOH Level 123.Started on levophed gtt. Also positive for covid-19.  MRI brain 8/14 showing severe hypoxic/ischemic injury. Palliative care has been consulted to assist with goals of care.  Assessment: - shock with multiorgan failure, shock resolved - acute hypoxemic respiratory failure, remains on full vent support - AKI, requiring CRRT - coagulopathy - severe anoxic brain injury  Recommendations/Plan: - family is in disagreement regarding decision on compassionate extubation - continue full support for now - I will try to speak with [name] tomorrow one on one  Code Status: DNR  Prognosis:   Poor in the setting of multi-system organ failure and severe anoxic brain injury  Discharge Planning:  To Be Determined  Care plan was discussed with Dr. Kendrick Fries  Thank you for allowing the Palliative Medicine Team to assist in the care of this patient.   Total Time 25 minutes Prolonged Time Billed  no       Greater than 50%  of this time was spent counseling and coordinating care related to the above assessment and plan.  Merry Proud, NP  Please contact Palliative Medicine Team phone at [phone] for questions and concerns.

## 2020-05-23 NOTE — Progress Notes (Signed)
NAME:  Stephen Fisher, MRN:  [id number], DOB:  [birth year], LOS: 7 ADMISSION DATE:  2020/06/04  Brief history  49 y/o male had an out of hospital cardiac arrest, at least 10 minutes.  Has evidence of anoxic brain injury on MRI brain and exam.  COVID postive.  Significant Hospital Events   8/15 discussion with family re severe anoxic brain injury on MRI, decision made to change code status to DNR, palliative medicine consulted  Consults:  PCCM palliative  Procedures:  ETT 8/10 >> 8/11>> Femoral CVC 8/11>> Femoral a-line  Significant Diagnostic Tests:  CT Head 8/10 > ? PRES CXR 8/10 > ? Upper lobe PNA, presumed 2/2 COVID 8/11 Echo> LVEF 65-70%, moderate LV hypertrophy, RV function normal 8/14 MRI Brain> severe diffuse anoxic brain injury noted  Micro Data:  8/12 MRSA PCR negative 8/10 Blood Cx negative  Antimicrobials/COVID Rx  Zosyn 8/10 >8/14 Ceftriaxone 8/14 with planned stop time 8/17  8/11 baricitinib >   Interim history/subjective:   Myoclonic jerking overnight Some ventilator dyssynchrony Sedation off for 3-4 hours on my exam  Objective   Blood pressure (!) 125/33, pulse 81, temperature 98.3 F (36.8 C), temperature source Axillary, resp. rate (!) 22, height 6' (1.829 m), weight 93.6 kg, SpO2 100 %.    Vent Mode: PRVC FiO2 (%):  [30 %] 30 % Set Rate:  [28 bmp] 28 bmp Vt Set:  [329 mL] 620 mL PEEP:  [5 cmH20] 5 cmH20 Plateau Pressure:  [18 cmH20-19 cmH20] 18 cmH20   Intake/Output Summary (Last 24 hours) at 05/23/2020 0802 Last data filed at 05/23/2020 0800 Gross per 24 hour  Intake 2880.63 ml  Output 4386 ml  Net -1505.37 ml   Filed Weights   05/21/20 0500 05/22/20 0500 05/23/20 0500  Weight: 99.2 kg 97 kg 93.6 kg    Examination:  General:  In bed on vent HENT: NCAT ETT in place PULM: CTA B, vent supported breathing CV: RRR, no mgr GI: BS+, soft, nontender MSK: normal bulk and tone Neuro: flex response to pain in left arm/leg, no eye  opening, + cough     Resolved Hospital Problem list     Assessment & Plan:  Shock with multiorgan failure> shock resolved Continue to monitor hemodynamics  Acute Hypoxemic Respiratory Failure Full mechanical vent support VAP prevention Daily WUA/SBT  AKI  Would continue to hold further CRRT  Monitor BMET and UOP Replace electrolytes as needed  Coagulopathy Monitor for bleeding Transfuse PRBC for Hgb < 7 gm/dL  Hyperglycemia SSI, lantus Monitor glucose   Severe anoxic brain injury > exam and MRI findings consistent with poor prognosis Myoclonic jerking Hold sedation  Disposition: lengthy conversation with family today, they voiced understanding.  1 of 3 children desires continued support. 2 of 3 children state that they would want to withdraw care.  Will continue to engage every day  Best practice:  Diet: Tube Feeds Pain/Anxiety/Delirium protocol (if indicated):  PAD protocol.  RASS goal -1. VAP protocol (if indicated):  In place. DVT prophylaxis: Heparin (on hold due to bleeding)  GI prophylaxis: Pepcid  Glucose control: SSI  Mobility: Bedrest Code Status: DNR Family Communication: as above Disposition: ICU  Labs   CBC: Recent Labs  Lab 05/20/20 0416 05/20/20 1830 05/21/20 0416 05/22/20 0455 05/23/20 0415  WBC 16.0* 14.4* 12.8* 10.1 9.8  NEUTROABS  --  13.6*  --   --   --   HGB 13.7 12.2* 11.7* 12.0* 12.8*  HCT 40.6 37.2* 35.8* 35.7* 38.3*  MCV 83.7 84.0 84.8 85.2 84.5  PLT 99* 82* 78* 87* 85*    Basic Metabolic Panel: Recent Labs  Lab 05/20/20 1359 05/20/20 1508 05/20/20 1718 05/21/20 0416 05/21/20 1540 05/22/20 0455 05/22/20 1504 05/23/20 0415  NA  --    < >  --  138 138 137 137 134*  K  --    < >  --  4.9 4.8 4.9 5.2* 5.3*  CL  --    < >  --  101 103 101 104 100  CO2  --    < >  --  24 23 22  21* 20*  GLUCOSE  --    < >  --  143* 154* 156* 160* 199*  BUN  --    < >  --  52* 59* 70* 68* 74*  CREATININE  --    < >  --  3.86* 3.68*  3.62* 3.50* 3.51*  CALCIUM  --    < >  --  7.8* 7.9* 7.9* 8.0* 8.1*  MG 2.1  --  2.1 2.6*  --  2.7*  --  2.8*  PHOS 4.2   < > 3.6 4.0 3.5 3.4 4.0 4.3   < > = values in this interval not displayed.   GFR: Estimated Creatinine Clearance: 30.9 mL/min (A) (by C-G formula based on SCr of 3.51 mg/dL (H)). Recent Labs  Lab 05/24/2020 2049 05/30/2020 2209 05/17/20 0725 05/17/20 1422 05/18/20 0420 05/19/20 0330 05/19/20 1107 05/19/20 1726 05/20/20 0416 05/20/20 1359 05/20/20 1830 05/21/20 0416 05/22/20 0455 05/23/20 0415  PROCALCITON 3.24  --  28.15  --  40.63  --   --   --   --   --   --   --   --   --   WBC  --    < > 31.8*  --  20.9*   < >  --   --    < >  --  14.4* 12.8* 10.1 9.8  LATICACIDVEN  --    < >  --    < > 5.6*  --  3.9* 4.3*  --  3.1*  --   --   --   --    < > = values in this interval not displayed.    Liver Function Tests: Recent Labs  Lab 05/13/2020 1913 06/03/2020 1913 05/12/2020 2049 05/11/2020 2049 05/17/20 1422 05/17/20 1742 05/18/20 0420 05/18/20 1515 05/20/20 1508 05/20/20 1508 05/21/20 0416 05/21/20 1540 05/22/20 0455 05/22/20 1504 05/23/20 0415  AST 5,312*  --  10,943*  --  >10,000*  --  >10,000*  --  1,971*  --   --   --   --   --   --   ALT 5,405*  --  8,756*  --  9,163*  --  7,807*  --  2,703*  --   --   --   --   --   --   ALKPHOS 76  --  98  --  113  --  128*  --  85  --   --   --   --   --   --   BILITOT 0.9  --  1.0  --  1.4*  --  1.7*  --  3.3*  --   --   --   --   --   --   PROT 4.8*  --  4.9*  --  4.5*  --  4.7*  --  4.5*  --   --   --   --   --   --  ALBUMIN 2.7*   < > 2.6*   < > 2.2*   < > 2.2*  2.2*   < > 2.0*  2.0*   < > 1.9* 2.0* 2.0* 2.1* 2.1*   < > = values in this interval not displayed.   No results for input(s): LIPASE, AMYLASE in the last 168 hours. Recent Labs  Lab 05/22/2020 2210  AMMONIA 159*    ABG    Component Value Date/Time   PHART 7.476 (H) 05/18/2020 1144   PCO2ART 33.3 05/18/2020 1144   PO2ART 114 (H) 05/18/2020  1144   HCO3 24.5 05/18/2020 1144   TCO2 25 05/18/2020 1144   ACIDBASEDEF 15.0 (H) 05/17/2020 1355   O2SAT 99.0 05/18/2020 1144     Coagulation Profile: Recent Labs  Lab 05/11/2020 1913 05/20/20 1830  INR 1.9* 1.2    Cardiac Enzymes: Recent Labs  Lab 05/17/20 0725 05/19/20 1122 05/22/20 0455  CKTOTAL 36,803* 37,988* 5,748*    HbA1C: Hgb A1c MFr Bld  Date/Time Value Ref Range Status  05/17/2020 02:22 PM 5.5 4.8 - 5.6 % Final    Comment:    (NOTE) Pre diabetes:          5.7%-6.4%  Diabetes:              >6.4%  Glycemic control for   <7.0% adults with diabetes     CBG: Recent Labs  Lab 05/22/20 1501 05/22/20 1931 05/22/20 2317 05/23/20 0356 05/23/20 0727  GLUCAP 159* 147* 168* 142* 188*     Critical care time: 30 minutes    The patient is critically ill with multiple organ system failure and requires high complexity decision making for assessment and support, frequent evaluation and titration of therapies, advanced monitoring, review of radiographic studies and interpretation of complex data.    Heber Alba, MD [place] [facility] Pager: [phone] Cell: [phone] If no response, call [phone]    See Amion for Pager Details

## 2020-05-23 NOTE — Progress Notes (Signed)
eLink Physician-Brief Progress Note Patient Name: Stephen Fisher DOB: 1970-12-14 MRN: [id number]   Date of Service  05/23/2020  HPI/Events of Note  Hyperkalemia - K+ = 5.3.   eICU Interventions  Plan: 1. Lokelma 10 gm per tube X 1 now. 2. Repeat BMP at 12 noon.      Intervention Category Major Interventions: Electrolyte abnormality - evaluation and management  Larna Capelle Eugene 05/23/2020, 6:29 AM

## 2020-05-23 NOTE — Progress Notes (Signed)
[facility] Progress Note   49 y.o.malefoundunresponsive in bedby the spouse. Asystole andVT, arrest x2 with known minimum 10 minutes down time. SHock +positive for COVID19. CK was 37K and Troponin 9460 w/ a lactic acid >11 w/ WBC 31.8K, cocaine and ETOH positive. Patient was in florid shock + lactic acidosis and renal failure treated with broad spectrum abx, 3 pressors, steroids, Remdesivir, baricitinib and HCO3. Initial pH of blood gas was 6.8.  Assessment/ Plan:   1. Acute renal failure-secondary to Covid/sepsis.  Likelihood of survival very low.  Kidney function unlikely to improve unless overall clinical status changes.  No signs of renal recovery at this time with minimal to no urine output.  -Seen on CRRT.tolerating current ultrafiltration rate. Continue 4K bath for now. -No clotting at this time -continue high-dose heparin -But will continue CRRT for now   2. COVID / septic shock -shock state improved but continuing to require ventilatory support 3. Cocaine positive -complicated by rhabdomyolysis. 4. S/p cardiac arrest: Severe illness as above 5. Hyperkalemia: Potassium on chemistry highest 5.3.  No need for acute intervention for potassium less than 5.5.  Would avoid Lokelma and shifting agents as long as potassium is below 5.5.  Continue 4K bath  Subjective:   Patient is tolerating renal replacement therapy.  Mild hyperkalemia over the past 24 hours.  Otherwise no issues.   Objective:   BP 138/77   Pulse 85   Temp 98 F (36.7 C) (Axillary)   Resp (!) 28   Ht 6' (1.829 m)   Wt 93.6 kg   SpO2 100%   BMI 27.99 kg/m   Intake/Output Summary (Last 24 hours) at 05/23/2020 1216 Last data filed at 05/23/2020 1200 Gross per 24 hour  Intake 2836.73 ml  Output 4585 ml  Net -1748.27 ml   Weight change: -3.4 kg  Physical Exam: GEN:On vent, sedated HEENT:No nasal discharge, dry mucous membranes LUNGS: Bilateral chest rise, ventilated CV: Normal  rate ABD: SNDNTminimal bowel sounds EXT:No lower extremity edema, rt arm swollen Access:rtfemoral catheter  Imaging: No results found.  Labs: BMET Recent Labs  Lab 05/20/20 0416 05/20/20 1359 05/20/20 1508 05/20/20 1718 05/21/20 0416 05/21/20 1540 05/22/20 0455 05/22/20 1504 05/23/20 0415  NA 137  --  138  --  138 138 137 137 134*  K 5.0  --  4.8  --  4.9 4.8 4.9 5.2* 5.3*  CL 98  --  99  --  101 103 101 104 100  CO2 26  --  27  --  _0 21* 20*  GLUCOSE 153*  --  92  --  143* 154* 156* 160* 199*  BUN 33*  --  38*  --  52* 59* 70* 68* 74*  CREATININE 3.95*  --  4.06*  --  3.86* 3.68* 3.62* 3.50* 3.51*  CALCIUM 7.5*  --  7.7*  --  7.8* 7.9* 7.9* 8.0* 8.1*  PHOS 3.0   < > 4.1 3.6 4.0 3.5 3.4 4.0 4.3   < > = values in this interval not displayed.   CBC Recent Labs  Lab 05/20/20 1830 05/21/20 0416 05/22/20 0455 05/23/20 0415  WBC 14.4* 12.8* 10.1 9.8  NEUTROABS 13.6*  --   --   --   HGB 12.2* 11.7* 12.0* 12.8*  HCT 37.2* 35.8* 35.7* 38.3*  MCV 84.0 84.8 85.2 84.5  PLT 82* 78* 87* 85*    Medications:    . baricitinib  2 mg Oral Daily  . chlorhexidine gluconate (MEDLINE  KIT)  15 mL Mouth Rinse BID  . Chlorhexidine Gluconate Cloth  6 each Topical Daily  . docusate  100 mg Per Tube BID  . feeding supplement (PROSource TF)  45 mL Per Tube TID  . folic acid  1 mg Per Tube Daily  . insulin aspart  0-20 Units Subcutaneous Q4H  . insulin glargine  15 Units Subcutaneous Daily  . lactulose  30 g Per Tube TID  . mouth rinse  15 mL Mouth Rinse 10 times per day  . methylPREDNISolone (SOLU-MEDROL) injection  1 mg/kg/day Intravenous Q12H  . pantoprazole (PROTONIX) IV  40 mg Intravenous Q12H  . sodium chloride flush  10-40 mL Intracatheter Q12H  . sodium chloride flush  3 mL Intravenous Q12H  . thiamine  100 mg Per Tube Daily  . vitamin B-12  100 mcg Per Tube Daily      Reesa Chew  05/23/2020, 12:16 PM

## 2020-05-24 LAB — CBC
HCT: 41.7 % (ref 39.0–52.0)
Hemoglobin: 14.2 g/dL (ref 13.0–17.0)
MCH: 28.7 pg (ref 26.0–34.0)
MCHC: 34.1 g/dL (ref 30.0–36.0)
MCV: 84.2 fL (ref 80.0–100.0)
Platelets: 83 10*3/uL — ABNORMAL LOW (ref 150–400)
RBC: 4.95 MIL/uL (ref 4.22–5.81)
RDW: 13.9 % (ref 11.5–15.5)
WBC: 13.3 10*3/uL — ABNORMAL HIGH (ref 4.0–10.5)
nRBC: 0 % (ref 0.0–0.2)

## 2020-05-24 LAB — RENAL FUNCTION PANEL
Albumin: 2.1 g/dL — ABNORMAL LOW (ref 3.5–5.0)
Albumin: 2.3 g/dL — ABNORMAL LOW (ref 3.5–5.0)
Anion gap: 15 (ref 5–15)
Anion gap: 15 (ref 5–15)
BUN: 86 mg/dL — ABNORMAL HIGH (ref 6–20)
BUN: 88 mg/dL — ABNORMAL HIGH (ref 6–20)
CO2: 18 mmol/L — ABNORMAL LOW (ref 22–32)
CO2: 19 mmol/L — ABNORMAL LOW (ref 22–32)
Calcium: 8.1 mg/dL — ABNORMAL LOW (ref 8.9–10.3)
Calcium: 8.3 mg/dL — ABNORMAL LOW (ref 8.9–10.3)
Chloride: 100 mmol/L (ref 98–111)
Chloride: 102 mmol/L (ref 98–111)
Creatinine, Ser: 3.48 mg/dL — ABNORMAL HIGH (ref 0.61–1.24)
Creatinine, Ser: 3.6 mg/dL — ABNORMAL HIGH (ref 0.61–1.24)
GFR calc Af Amer: 23 mL/min — ABNORMAL LOW (ref >60)
GFR calc Af Amer: 22 mL/min — ABNORMAL LOW (ref >60)
GFR calc non Af Amer: 20 mL/min — ABNORMAL LOW (ref >60)
GFR calc non Af Amer: 19 mL/min — ABNORMAL LOW (ref >60)
Glucose, Bld: 208 mg/dL — ABNORMAL HIGH (ref 70–99)
Glucose, Bld: 177 mg/dL — ABNORMAL HIGH (ref 70–99)
Phosphorus: 4.5 mg/dL (ref 2.5–4.6)
Phosphorus: 6.4 mg/dL — ABNORMAL HIGH (ref 2.5–4.6)
Potassium: 5.6 mmol/L — ABNORMAL HIGH (ref 3.5–5.1)
Potassium: 5.3 mmol/L — ABNORMAL HIGH (ref 3.5–5.1)
Sodium: 133 mmol/L — ABNORMAL LOW (ref 135–145)
Sodium: 136 mmol/L (ref 135–145)

## 2020-05-24 LAB — GLUCOSE, CAPILLARY
Glucose-Capillary: 133 mg/dL — ABNORMAL HIGH (ref 70–99)
Glucose-Capillary: 159 mg/dL — ABNORMAL HIGH (ref 70–99)
Glucose-Capillary: 184 mg/dL — ABNORMAL HIGH (ref 70–99)
Glucose-Capillary: 185 mg/dL — ABNORMAL HIGH (ref 70–99)
Glucose-Capillary: 205 mg/dL — ABNORMAL HIGH (ref 70–99)
Glucose-Capillary: 214 mg/dL — ABNORMAL HIGH (ref 70–99)

## 2020-05-24 LAB — MAGNESIUM: Magnesium: 2.8 mg/dL — ABNORMAL HIGH (ref 1.7–2.4)

## 2020-05-24 LAB — APTT: aPTT: 27 seconds (ref 24–36)

## 2020-05-24 MED ORDER — INSULIN GLARGINE 100 UNIT/ML ~~LOC~~ SOLN
20.0000 [IU] | Freq: Every day | SUBCUTANEOUS | Status: DC
  Administered 2020-05-25 – 2020-05-26 (×2): 20 [IU] via SUBCUTANEOUS
  Filled 2020-05-24 (×2): qty 0.2

## 2020-05-24 MED ORDER — LABETALOL HCL 5 MG/ML IV SOLN: 20.0000 mg | INTRAVENOUS | Status: DC | PRN

## 2020-05-24 MED ORDER — PRISMASOL BGK 0/2.5 32-2.5 MEQ/L IV SOLN
INTRAVENOUS | Status: DC
  Filled 2020-05-24 (×5): qty 5000

## 2020-05-24 MED ORDER — FENTANYL CITRATE (PF) 100 MCG/2ML IJ SOLN
25.0000 ug | INTRAMUSCULAR | Status: DC | PRN
  Administered 2020-05-25 – 2020-05-26 (×3): 100 ug via INTRAVENOUS

## 2020-05-24 MED ORDER — FENTANYL 2500MCG IN NS 250ML (10MCG/ML) PREMIX INFUSION
0.0000 ug/h | INTRAVENOUS | Status: DC
  Administered 2020-05-24 – 2020-05-26 (×3): 100 ug/h via INTRAVENOUS
  Filled 2020-05-24 (×3): qty 250

## 2020-05-24 MED ORDER — PRISMASOL BGK 0/2.5 32-2.5 MEQ/L IV SOLN
INTRAVENOUS | Status: DC
  Filled 2020-05-24 (×8): qty 5000

## 2020-05-24 MED ORDER — PRISMASOL BGK 0/2.5 32-2.5 MEQ/L IV SOLN
INTRAVENOUS | Status: DC
  Filled 2020-05-24 (×27): qty 5000

## 2020-05-24 NOTE — Progress Notes (Signed)
NAME:  Stephen Fisher, MRN:  [id number], DOB:  [birth year], LOS: 8 ADMISSION DATE:  06/06/2020  Brief history  49 y/o male had an out of hospital cardiac arrest, at least 10 minutes.  Has evidence of anoxic brain injury on MRI brain and exam.  COVID postive.  Significant Hospital Events   8/15 discussion with family re severe anoxic brain injury on MRI, decision made to change code status to DNR, palliative medicine consulted  Consults:  PCCM palliative  Procedures:  ETT 8/10 >> 8/11>> Femoral CVC 8/11>> Femoral a-line  Significant Diagnostic Tests:  CT Head 8/10 > ? PRES CXR 8/10 > ? Upper lobe PNA, presumed 2/2 COVID 8/11 Echo> LVEF 65-70%, moderate LV hypertrophy, RV function normal 8/14 MRI Brain> severe diffuse anoxic brain injury noted  Micro Data:  8/12 MRSA PCR negative 8/10 Blood Cx negative  Antimicrobials/COVID Rx  Zosyn 8/10 >8/14 Ceftriaxone 8/14 with planned stop time 8/17  8/11 baricitinib >   Interim history/subjective:   Sedation restarted overnight for hypertension and vent dyssynchrony and myoclonic jerking Remains on CRRT  Objective   Blood pressure 140/86, pulse 91, temperature 98.5 F (36.9 C), temperature source Axillary, resp. rate (!) 28, height 6' (1.829 m), weight 92 kg, SpO2 99 %.    Vent Mode: PRVC FiO2 (%):  [30 %] 30 % Set Rate:  [24 bmp-28 bmp] 28 bmp Vt Set:  [366 mL] 620 mL PEEP:  [5 cmH20] 5 cmH20 Plateau Pressure:  [18 cmH20-28 cmH20] 18 cmH20   Intake/Output Summary (Last 24 hours) at 05/24/2020 4403 Last data filed at 05/24/2020 0800 Gross per 24 hour  Intake 2245.23 ml  Output 3523 ml  Net -1277.77 ml   Filed Weights   05/22/20 0500 05/23/20 0500 05/24/20 0442  Weight: 97 kg 93.6 kg 92 kg    General:  In bed on vent HENT: NCAT ETT in place PULM: CTA B, vent supported breathing CV: RRR, no mgr GI: BS+, soft, nontender MSK: normal bulk and tone Neuro: on vent, no response to external stimuli (on  sedation)   Resolved Hospital Problem list     Assessment & Plan:  Shock with multiorgan failure> shock resolved Continue to monitor hemodynamics  Acute Hypoxemic Respiratory Failure Full mechanical vent support VAP prevention Daily WUA/SBT  AKI  Would continue to hold further CRRT  Monitor BMET and UOP Replace electrolytes as needed  Coagulopathy Monitor for bleeding Transfuse PRBC for Hgb < 7 gm/dL  Hyperglycemia SSI, lantus Monitor glucose   Hypertension Labetalol prn  Severe anoxic brain injury > exam and MRI findings consistent with poor prognosis Myoclonic jerking Hold infusions of sedation  Disposition:  I called his children yesterday, see note.  They did not answer the phone when I called today.  Will continue to reach out.  Do not expect a good outcome.  2 of 3 children favor withdrawal of care, they desire to be unified as a family prior to making decisions.  Best practice:  Diet: Tube Feeds Pain/Anxiety/Delirium protocol (if indicated):  RASS goal 0 VAP protocol (if indicated):  In place. DVT prophylaxis: Heparin (on hold due to bleeding)  GI prophylaxis: Pepcid  Glucose control: SSI  Mobility: Bedrest Code Status: DNR Family Communication: as above Disposition: ICU  Labs   CBC: Recent Labs  Lab 05/20/20 1830 05/21/20 0416 05/22/20 0455 05/23/20 0415 05/24/20 0430  WBC 14.4* 12.8* 10.1 9.8 13.3*  NEUTROABS 13.6*  --   --   --   --  HGB 12.2* 11.7* 12.0* 12.8* 14.2  HCT 37.2* 35.8* 35.7* 38.3* 41.7  MCV 84.0 84.8 85.2 84.5 84.2  PLT 82* 78* 87* 85* 83*    Basic Metabolic Panel: Recent Labs  Lab 05/20/20 1508 05/20/20 1718 05/21/20 0416 05/21/20 1540 05/22/20 0455 05/22/20 1504 05/23/20 0415 05/23/20 1554 05/24/20 0430  NA   < >  --  138   < > 137 137 134* 137 133*  K   < >  --  4.9   < > 4.9 5.2* 5.3* 5.5* 5.6*  CL   < >  --  101   < > 101 104 100 104 100  CO2   < >  --  24   < > 22 21* 20* 18* 18*  GLUCOSE   < >  --   143*   < > 156* 160* 199* 199* 208*  BUN   < >  --  52*   < > 70* 68* 74* 81* 86*  CREATININE   < >  --  3.86*   < > 3.62* 3.50* 3.51* 3.33* 3.48*  CALCIUM   < >  --  7.8*   < > 7.9* 8.0* 8.1* 7.8* 8.1*  MG  --  2.1 2.6*  --  2.7*  --  2.8*  --  2.8*  PHOS   < > 3.6 4.0   < > 3.4 4.0 4.3 4.4 4.5   < > = values in this interval not displayed.   GFR: Estimated Creatinine Clearance: 28.8 mL/min (A) (by C-G formula based on SCr of 3.48 mg/dL (H)). Recent Labs  Lab 05/18/20 0420 05/19/20 0330 05/19/20 1107 05/19/20 1726 05/20/20 0416 05/20/20 1359 05/20/20 1830 05/21/20 0416 05/22/20 0455 05/23/20 0415 05/24/20 0430  PROCALCITON 40.63  --   --   --   --   --   --   --   --   --   --   WBC 20.9*   < >  --   --    < >  --    < > 12.8* 10.1 9.8 13.3*  LATICACIDVEN 5.6*  --  3.9* 4.3*  --  3.1*  --   --   --   --   --    < > = values in this interval not displayed.    Liver Function Tests: Recent Labs  Lab 05/17/20 1422 05/17/20 1742 05/18/20 0420 05/18/20 1515 05/20/20 1508 05/21/20 0416 05/22/20 0455 05/22/20 1504 05/23/20 0415 05/23/20 1554 05/24/20 0430  AST >10,000*  --  >10,000*  --  1,971*  --   --   --   --   --   --   ALT 9,163*  --  7,807*  --  2,703*  --   --   --   --   --   --   ALKPHOS 113  --  128*  --  85  --   --   --   --   --   --   BILITOT 1.4*  --  1.7*  --  3.3*  --   --   --   --   --   --   PROT 4.5*  --  4.7*  --  4.5*  --   --   --   --   --   --   ALBUMIN 2.2*   < > 2.2*  2.2*   < > 2.0*  2.0*   < > 2.0* 2.1* 2.1*  1.9* 2.1*   < > = values in this interval not displayed.   No results for input(s): LIPASE, AMYLASE in the last 168 hours. No results for input(s): AMMONIA in the last 168 hours.  ABG    Component Value Date/Time   PHART 7.476 (H) 05/18/2020 1144   PCO2ART 33.3 05/18/2020 1144   PO2ART 114 (H) 05/18/2020 1144   HCO3 24.5 05/18/2020 1144   TCO2 25 05/18/2020 1144   ACIDBASEDEF 15.0 (H) 05/17/2020 1355   O2SAT 99.0 05/18/2020  1144     Coagulation Profile: Recent Labs  Lab 05/20/20 1830  INR 1.2    Cardiac Enzymes: Recent Labs  Lab 05/19/20 1122 05/22/20 0455  CKTOTAL 37,988* 5,748*    HbA1C: Hgb A1c MFr Bld  Date/Time Value Ref Range Status  05/17/2020 02:22 PM 5.5 4.8 - 5.6 % Final    Comment:    (NOTE) Pre diabetes:          5.7%-6.4%  Diabetes:              >6.4%  Glycemic control for   <7.0% adults with diabetes     CBG: Recent Labs  Lab 05/23/20 1552 05/23/20 1923 05/23/20 2315 05/24/20 0311 05/24/20 0733  GLUCAP 195* 192* 161* 184* 205*     Critical care time: 30 minutes    The patient is critically ill with multiple organ system failure and requires high complexity decision making for assessment and support, frequent evaluation and titration of therapies, advanced monitoring, review of radiographic studies and interpretation of complex data.    Heber Creston, MD [place] Pager: [phone] Cell: [phone] If no response, call [phone]

## 2020-05-24 NOTE — Progress Notes (Signed)
[facility] Progress Note   49 y.o.malefoundunresponsive in bedby the spouse. Asystole andVT, arrest x2 with known minimum 10 minutes down time. SHock +positive for COVID19. CK was 37K and Troponin 9460 w/ a lactic acid >11 w/ WBC 31.8K, cocaine and ETOH positive. Patient was in florid shock + lactic acidosis and renal failure treated with broad spectrum abx, 3 pressors, steroids, Remdesivir, baricitinib and HCO3. Initial pH of blood gas was 6.8.  Assessment/ Plan:   1. Acute renal failure-secondary to Covid/sepsis.  Likelihood of survival very low.  Kidney function unlikely to improve unless overall clinical status changes.  No signs of renal recovery at this time with minimal to no urine output.  -Seen on CRRT.tolerating current ultrafiltration rate.  Switch to 2K bath given hyperkalemia -No clotting at this time -continue high-dose heparin -Continue CRRT for now.  Continue goals of care with palliative care and primary team   2. COVID / septic shock -shock state improved but continuing to require ventilatory support 3. Cocaine positive -complicated by rhabdomyolysis. 4. S/p cardiac arrest: Severe illness as above 5. Hyperkalemia: Persistent.  We will switch to 2K bath.  Subjective:   No significant changes over the past 24 hours.  Relatively stable.  Mild hyperkalemia ongoing.   Objective:   BP 140/86   Pulse 91   Temp 98 F (36.7 C) (Axillary)   Resp (!) 28   Ht 6' (1.829 m)   Wt 92 kg   SpO2 99%   BMI 27.51 kg/m   Intake/Output Summary (Last 24 hours) at 05/24/2020 1209 Last data filed at 05/24/2020 1100 Gross per 24 hour  Intake 2280.68 ml  Output 3391 ml  Net -1110.32 ml   Weight change: -1.6 kg  Physical Exam: GEN:On vent, sedated HEENT:No nasal discharge, dry mucous membranes LUNGS: Bilateral chest rise, ventilated CV: Normal rate ABD: Nondistended EXT:No lower extremity edema, warm Access:rtfemoral catheter  Imaging: No  results found.  Labs: BMET Recent Labs  Lab 05/21/20 0416 05/21/20 1540 05/22/20 0455 05/22/20 1504 05/23/20 0415 05/23/20 1554 05/24/20 0430  NA 138 138 137 137 134* 137 133*  K 4.9 4.8 4.9 5.2* 5.3* 5.5* 5.6*  CL 101 103 101 104 100 104 100  CO2 _0 21* 20* 18* 18*  GLUCOSE 143* 154* 156* 160* 199* 199* 208*  BUN 52* 59* 70* 68* 74* 81* 86*  CREATININE 3.86* 3.68* 3.62* 3.50* 3.51* 3.33* 3.48*  CALCIUM 7.8* 7.9* 7.9* 8.0* 8.1* 7.8* 8.1*  PHOS 4.0 3.5 3.4 4.0 4.3 4.4 4.5   CBC Recent Labs  Lab 05/20/20 1830 05/20/20 1830 05/21/20 0416 05/22/20 0455 05/23/20 0415 05/24/20 0430  WBC 14.4*   < > 12.8* 10.1 9.8 13.3*  NEUTROABS 13.6*  --   --   --   --   --   HGB 12.2*   < > 11.7* 12.0* 12.8* 14.2  HCT 37.2*   < > 35.8* 35.7* 38.3* 41.7  MCV 84.0   < > 84.8 85.2 84.5 84.2  PLT 82*   < > 78* 87* 85* 83*   < > = values in this interval not displayed.    Medications:    . baricitinib  2 mg Oral Daily  . chlorhexidine gluconate (MEDLINE KIT)  15 mL Mouth Rinse BID  . Chlorhexidine Gluconate Cloth  6 each Topical Daily  . docusate  100 mg Per Tube BID  . feeding supplement (PROSource TF)  45 mL Per Tube TID  . folic acid  1  mg Per Tube Daily  . insulin aspart  0-20 Units Subcutaneous Q4H  . [START ON 05/25/2020] insulin glargine  20 Units Subcutaneous Daily  . lactulose  30 g Per Tube TID  . mouth rinse  15 mL Mouth Rinse 10 times per day  . methylPREDNISolone (SOLU-MEDROL) injection  1 mg/kg/day Intravenous Q12H  . pantoprazole (PROTONIX) IV  40 mg Intravenous Q12H  . sodium chloride flush  10-40 mL Intracatheter Q12H  . sodium chloride flush  3 mL Intravenous Q12H  . thiamine  100 mg Per Tube Daily  . vitamin B-12  100 mcg Per Tube Daily      Reesa Chew  05/24/2020, 12:09 PM

## 2020-05-24 NOTE — Progress Notes (Signed)
Daily Progress Note   Patient Name: Stephen Fisher       Date: 05/24/2020 DOB: 1971-04-10  Age: 49 y.o. MRN#: [id number] Attending Physician: Lupita Leash, MD Primary Care Physician: Patient, No Pcp Per Admit Date: 04-Jun-2020  Reason for Consultation/Follow-up: continued GOC discussion  Subjective: Attempted to call daughter Stephen Fisher with no answer--voicemail left. I then called patient's significant other, Stephen Fisher. I learned that she is not the person who was with patient at the time of cardiac arrest. Stephen shares with me that she and the patient have been together for 5 years. She is from this area, but is currently living out of state to pursue medical treatment although she doesn't provide details about her illness.  I review with Stephen the patient's current condition and prognosis; she seems to fully understand he has no chance for meaningful recovery. I expressed my concern about the children being faced with such a difficult decision, given their ages and that they already lost their mother. I told her about my conference call with the children yesterday, and that Stephen Fisher was very upset and hung up. Stephen states that she has been in close communication with Stephen Fisher. She shares that Stephen Fisher stated to her her in confidence that she was leaning toward removal from the ventilator, but wanted to give him a few more days to prevent any guilt about the decision. Stephen thinks Stephen Fisher will come to a decision over the weekend.  I discussed with Stephen that patient will need a tracheostomy by 14 days on the ventilator, and that today is day 9. She verbalizes understanding, and states that Stephen Fisher also understands this.   Length of Stay: 8  Physical Exam Vitals reviewed.    Constitutional:      Comments: CRRT  Pulmonary:     Comments: Intubated, ventilator  Neurological:     Comments: No purposeful movement             Vital Signs: BP (!) 155/65 (BP Location: Right Arm)   Pulse 86   Temp 98 F (36.7 C) (Axillary)   Resp (!) 28   Ht 6' (1.829 m)   Wt 92 kg   SpO2 100%   BMI 27.51 kg/m  SpO2: SpO2: 100 % O2 Device: O2 Device: Ventilator O2 Flow Rate:  Palliative Assessment/Data: 10%      Patient Active Problem List   Diagnosis Date Noted  . Acute respiratory failure (HCC)   . Alcoholic intoxication with complication (HCC)   . Anoxic brain injury (HCC)   . Goals of care, counseling/discussion   . Palliative care by specialist   . Acidosis   . AKI (acute kidney injury) (HCC)   . Cocaine abuse (HCC)   . COVID-19   . Respiratory failure (HCC)   . Cardiac arrest [facility]) 05/30/20    Palliative Care Assessment & Plan   HPI/Patient Profile:49 y.o.malewith unknown past medical history [facility] post cardiac arrest.Per fiancee, patient was drinking last night when they got into an argument and he left the house around midnight. He returned home the next morning, they continued drinking and then he passed out around lunch time. When she came back hours later, he was snoring and she was unable to wake him up so she called EMS. Upon EMS arrival, patient was pulseless in asystole. ROSC after estimated 10 minutes, one episode of V-tach requiring defibrillation. ROSC was achieved briefly before loss of pulse again. Per EMS, 3 rounds epi given in route. CPR ongoing on arrival to ED. ROSC achieved briefly after arrival.  In the ED, labs significant forABG 6.831/74/420/15. AST/ALT 5312/5405. Troponin 953,UDS +Cocaine,ETOH Level 123.Started on levophed gtt. Also positive for covid-19.  MRI brain 8/14 showing severe hypoxic/ischemic injury. Palliative care has been consulted to assist with  goals of care.  Assessment: - shock with multiorgan failure, shock resolved - acute hypoxemic respiratory failure, remains on full vent support - AKI, requiring CRRT - coagulopathy - severe anoxic brain injury  Recommendations/Plan: - continue full support for now - family needs a few more days, but final decision will likely be compassionate decision and not trach/PEG - 2 of 3 children are in favor of compassionate extubation but will not override the youngest sister Stephen Fisher - patient's fiancee Stephen Fisher understands the poor prognosis and is a support system for Stephen Fisher - per Weldon Inches wants to give patient a few more days but has expressed she wouldn't want to proceed with a trach - Palliative Medicine will continue to shadow, but will not actively engage with family for a few days to allow them time to process   Code Status: DNR  Prognosis:   Poor in the setting of multi-system organ failure and severe anoxic brain injury  Discharge Planning:  To Be Determined  Care plan was discussed with Dr. Kendrick Fries, nursing  Thank you for allowing the Palliative Medicine Team to assist in the care of this patient.   Total Time 25 minutes Prolonged Time Billed  no       Greater than 50%  of this time was spent counseling and coordinating care related to the above assessment and plan.  Stephen Proud, NP  Please contact Palliative Medicine Team phone at [phone] for questions and concerns.

## 2020-05-25 LAB — POCT ACTIVATED CLOTTING TIME
Activated Clotting Time: 125 seconds
Activated Clotting Time: 125 seconds
Activated Clotting Time: 142 seconds
Activated Clotting Time: 147 seconds
Activated Clotting Time: 158 seconds
Activated Clotting Time: 158 seconds
Activated Clotting Time: 147 seconds
Activated Clotting Time: 169 seconds
Activated Clotting Time: 169 seconds
Activated Clotting Time: 169 seconds
Activated Clotting Time: 175 seconds

## 2020-05-25 LAB — GLUCOSE, CAPILLARY
Glucose-Capillary: 178 mg/dL — ABNORMAL HIGH (ref 70–99)
Glucose-Capillary: 179 mg/dL — ABNORMAL HIGH (ref 70–99)
Glucose-Capillary: 212 mg/dL — ABNORMAL HIGH (ref 70–99)
Glucose-Capillary: 213 mg/dL — ABNORMAL HIGH (ref 70–99)
Glucose-Capillary: 228 mg/dL — ABNORMAL HIGH (ref 70–99)
Glucose-Capillary: 251 mg/dL — ABNORMAL HIGH (ref 70–99)

## 2020-05-25 LAB — CBC
HCT: 41.4 % (ref 39.0–52.0)
Hemoglobin: 13.6 g/dL (ref 13.0–17.0)
MCH: 27.9 pg (ref 26.0–34.0)
MCHC: 32.9 g/dL (ref 30.0–36.0)
MCV: 85 fL (ref 80.0–100.0)
Platelets: 106 10*3/uL — ABNORMAL LOW (ref 150–400)
RBC: 4.87 MIL/uL (ref 4.22–5.81)
RDW: 13.9 % (ref 11.5–15.5)
WBC: 18.8 10*3/uL — ABNORMAL HIGH (ref 4.0–10.5)
nRBC: 0 % (ref 0.0–0.2)

## 2020-05-25 LAB — RENAL FUNCTION PANEL
Albumin: 2.1 g/dL — ABNORMAL LOW (ref 3.5–5.0)
Albumin: 2.2 g/dL — ABNORMAL LOW (ref 3.5–5.0)
Anion gap: 14 (ref 5–15)
Anion gap: 13 (ref 5–15)
BUN: 86 mg/dL — ABNORMAL HIGH (ref 6–20)
BUN: 91 mg/dL — ABNORMAL HIGH (ref 6–20)
CO2: 17 mmol/L — ABNORMAL LOW (ref 22–32)
CO2: 18 mmol/L — ABNORMAL LOW (ref 22–32)
Calcium: 7.9 mg/dL — ABNORMAL LOW (ref 8.9–10.3)
Calcium: 7.6 mg/dL — ABNORMAL LOW (ref 8.9–10.3)
Chloride: 101 mmol/L (ref 98–111)
Chloride: 102 mmol/L (ref 98–111)
Creatinine, Ser: 3.78 mg/dL — ABNORMAL HIGH (ref 0.61–1.24)
Creatinine, Ser: 3.92 mg/dL — ABNORMAL HIGH (ref 0.61–1.24)
GFR calc Af Amer: 21 mL/min — ABNORMAL LOW (ref >60)
GFR calc Af Amer: 20 mL/min — ABNORMAL LOW (ref >60)
GFR calc non Af Amer: 18 mL/min — ABNORMAL LOW (ref >60)
GFR calc non Af Amer: 17 mL/min — ABNORMAL LOW (ref >60)
Glucose, Bld: 225 mg/dL — ABNORMAL HIGH (ref 70–99)
Glucose, Bld: 195 mg/dL — ABNORMAL HIGH (ref 70–99)
Phosphorus: 6.3 mg/dL — ABNORMAL HIGH (ref 2.5–4.6)
Phosphorus: 6.2 mg/dL — ABNORMAL HIGH (ref 2.5–4.6)
Potassium: 5.7 mmol/L — ABNORMAL HIGH (ref 3.5–5.1)
Potassium: 5.1 mmol/L (ref 3.5–5.1)
Sodium: 132 mmol/L — ABNORMAL LOW (ref 135–145)
Sodium: 133 mmol/L — ABNORMAL LOW (ref 135–145)

## 2020-05-25 LAB — MAGNESIUM: Magnesium: 2.8 mg/dL — ABNORMAL HIGH (ref 1.7–2.4)

## 2020-05-25 LAB — APTT: aPTT: 29 seconds (ref 24–36)

## 2020-05-25 MED ORDER — ALTEPLASE 2 MG IJ SOLR
2.0000 mg | Freq: Once | INTRAMUSCULAR | Status: AC
  Administered 2020-05-25: 2 mg
  Filled 2020-05-25: qty 2

## 2020-05-25 MED ORDER — POLYETHYLENE GLYCOL 3350 17 G PO PACK: 17.0000 g | PACK | Freq: Every day | ORAL | Status: DC | PRN

## 2020-05-25 MED ORDER — BARICITINIB 1 MG PO TABS
1.0000 mg | ORAL_TABLET | Freq: Every day | ORAL | Status: DC
  Administered 2020-05-25 – 2020-05-26 (×2): 1 mg
  Filled 2020-05-25 (×2): qty 1

## 2020-05-25 MED ORDER — SODIUM CHLORIDE 0.9 % IV SOLN
250.0000 [IU]/h | INTRAVENOUS | Status: DC
  Administered 2020-05-25: 1250 [IU]/h via INTRAVENOUS_CENTRAL
  Administered 2020-05-25: 650 [IU]/h via INTRAVENOUS_CENTRAL
  Administered 2020-05-25: 1050 [IU]/h via INTRAVENOUS_CENTRAL
  Administered 2020-05-25: 250 [IU]/h via INTRAVENOUS_CENTRAL
  Administered 2020-05-26: 1800 [IU]/h via INTRAVENOUS_CENTRAL
  Administered 2020-05-26 (×2): 1850 [IU]/h via INTRAVENOUS_CENTRAL
  Administered 2020-05-26: 1800 [IU]/h via INTRAVENOUS_CENTRAL
  Filled 2020-05-25 (×6): qty 2

## 2020-05-25 MED ORDER — MIDAZOLAM HCL 2 MG/2ML IJ SOLN
1.0000 mg | INTRAMUSCULAR | Status: DC | PRN
  Administered 2020-05-25 – 2020-05-26 (×3): 2 mg via INTRAVENOUS
  Filled 2020-05-25 (×3): qty 2

## 2020-05-25 MED ORDER — BARICITINIB 1 MG PO TABS
1.0000 mg | ORAL_TABLET | Freq: Every day | ORAL | Status: DC
  Filled 2020-05-25: qty 1

## 2020-05-25 MED ORDER — ALTEPLASE 2 MG IJ SOLR
2.0000 mg | Freq: Once | INTRAMUSCULAR | Status: AC
  Administered 2020-05-25: 2 mg

## 2020-05-25 MED ORDER — HEPARIN BOLUS VIA INFUSION (CRRT)
1000.0000 [IU] | INTRAVENOUS | Status: DC | PRN
  Administered 2020-05-25 (×4): 1000 [IU] via INTRAVENOUS_CENTRAL
  Filled 2020-05-25: qty 1000

## 2020-05-25 NOTE — Progress Notes (Signed)
CRRT filter clotted third time in 24 hours; access and return lines on dialysis catheter noticeably difficult to flush and draw back. IV Team consulted for TPA administration. CRRT stopped at this time; catheter locked with heparin.

## 2020-05-25 NOTE — Progress Notes (Signed)
NAME:  Stephen Fisher, MRN:  [id number], DOB:  1971-05-29, LOS: 9 ADMISSION DATE:  05/17/2020  Brief history  49 y/o male had an out of hospital cardiac arrest, at least 10 minutes.  Has evidence of anoxic brain injury on MRI brain and exam.  COVID postive.  Significant Hospital Events   8/15 discussion with family re severe anoxic brain injury on MRI, decision made to change code status to DNR, palliative medicine consulted  Consults:  PCCM palliative  Procedures:  ETT 8/10 >> 8/11>> Femoral CVC 8/11>> Femoral a-line  Significant Diagnostic Tests:  CT Head 8/10 > ? PRES CXR 8/10 > ? Upper lobe PNA, presumed 2/2 COVID 8/11 Echo> LVEF 65-70%, moderate LV hypertrophy, RV function normal 8/14 MRI Brain> severe diffuse anoxic brain injury noted  Micro Data:  8/12 MRSA PCR negative 8/10 Blood Cx negative  Antimicrobials/COVID Rx  Zosyn 8/10 >8/14 Ceftriaxone 8/14 with planned stop time 8/17  8/11 baricitinib >   Interim history/subjective:   HD cath clotted last night Filter clotted  Objective   Blood pressure 103/67, pulse (!) 110, temperature 98.2 F (36.8 C), temperature source Axillary, resp. rate (!) 28, height 6' (1.829 m), weight 91.4 kg, SpO2 90 %.    Vent Mode: PRVC FiO2 (%):  [30 %] 30 % Set Rate:  [28 bmp] 28 bmp Vt Set:  [063 mL] 620 mL PEEP:  [5 cmH20] 5 cmH20 Plateau Pressure:  [18 cmH20-23 cmH20] 23 cmH20   Intake/Output Summary (Last 24 hours) at 05/25/2020 0726 Last data filed at 05/25/2020 0700 Gross per 24 hour  Intake 1561.07 ml  Output 3085 ml  Net -1523.93 ml   Filed Weights   05/23/20 0500 05/24/20 0442 05/25/20 0415  Weight: 93.6 kg 92 kg 91.4 kg    General:  In bed on vent HENT: NCAT ETT in place PULM: CTA B, vent supported breathing CV: RRR, no mgr GI: BS+, soft, nontender MSK: normal bulk and tone Neuro: sedated on vent   Resolved Hospital Problem list     Assessment & Plan:  Shock with multiorgan failure> shock  resolved Continue to monitor hemodynamics  Acute Hypoxemic Respiratory Failure Full mechanical vent support VAP prevention Holding SBT given mental status  AKI  CRRT continuing until family clear about goals of care Monitor BMET and UOP Replace electrolytes as needed TPA to HD cath today Heparin to CRRT circuit today  Coagulopathy Monitor for bleeding Transfuse PRBC for Hgb < 7 gm/dL  Hyperglycemia SSI, lantus 20 Units Monitor glucose  Hypertension Labetalol prn  Severe anoxic brain injury > exam and MRI findings consistent with poor prognosis Myoclonic jerking Low dose fentanyl infusion to help with vent synchrony and to minimize valsalva (from biting on tube)  Disposition:  Attempted to call his children yesterday.    Best practice:  Diet: Tube Feeds Pain/Anxiety/Delirium protocol (if indicated):  RASS goal 0 VAP protocol (if indicated):  In place. DVT prophylaxis: Heparin (on hold due to bleeding)  GI prophylaxis: Pepcid  Glucose control: SSI  Mobility: Bedrest Code Status: DNR Family Communication: I called Marquel on 8/19 and he asked me to call back.   Disposition: ICU  Labs   CBC: Recent Labs  Lab 05/20/20 1830 05/20/20 1830 05/21/20 0416 05/22/20 0455 05/23/20 0415 05/24/20 0430 05/25/20 0421  WBC 14.4*   < > 12.8* 10.1 9.8 13.3* 18.8*  NEUTROABS 13.6*  --   --   --   --   --   --   HGB  12.2*   < > 11.7* 12.0* 12.8* 14.2 13.6  HCT 37.2*   < > 35.8* 35.7* 38.3* 41.7 41.4  MCV 84.0   < > 84.8 85.2 84.5 84.2 85.0  PLT 82*   < > 78* 87* 85* 83* 106*   < > = values in this interval not displayed.    Basic Metabolic Panel: Recent Labs  Lab 05/21/20 0416 05/21/20 1540 05/22/20 0455 05/22/20 1504 05/23/20 0415 05/23/20 1554 05/24/20 0430 05/24/20 1636 05/25/20 0420  NA 138   < > 137   < > 134* 137 133* 136 132*  K 4.9   < > 4.9   < > 5.3* 5.5* 5.6* 5.3* 5.7*  CL 101   < > 101   < > 100 104 100 102 101  CO2 24   < > 22   < > 20* 18* 18*  19* 17*  GLUCOSE 143*   < > 156*   < > 199* 199* 208* 177* 225*  BUN 52*   < > 70*   < > 74* 81* 86* 88* 86*  CREATININE 3.86*   < > 3.62*   < > 3.51* 3.33* 3.48* 3.60* 3.78*  CALCIUM 7.8*   < > 7.9*   < > 8.1* 7.8* 8.1* 8.3* 7.9*  MG 2.6*  --  2.7*  --  2.8*  --  2.8*  --  2.8*  PHOS 4.0   < > 3.4   < > 4.3 4.4 4.5 6.4* 6.3*   < > = values in this interval not displayed.   GFR: Estimated Creatinine Clearance: 26.5 mL/min (A) (by C-G formula based on SCr of 3.78 mg/dL (H)). Recent Labs  Lab 05/19/20 1107 05/19/20 1726 05/20/20 0416 05/20/20 1359 05/20/20 1830 05/22/20 0455 05/23/20 0415 05/24/20 0430 05/25/20 0421  WBC  --   --    < >  --    < > 10.1 9.8 13.3* 18.8*  LATICACIDVEN 3.9* 4.3*  --  3.1*  --   --   --   --   --    < > = values in this interval not displayed.    Liver Function Tests: Recent Labs  Lab 05/20/20 1508 05/21/20 0416 05/23/20 0415 05/23/20 1554 05/24/20 0430 05/24/20 1636 05/25/20 0420  AST 1,971*  --   --   --   --   --   --   ALT 2,703*  --   --   --   --   --   --   ALKPHOS 85  --   --   --   --   --   --   BILITOT 3.3*  --   --   --   --   --   --   PROT 4.5*  --   --   --   --   --   --   ALBUMIN 2.0*   2.0*   < > 2.1* 1.9* 2.1* 2.3* 2.1*   < > = values in this interval not displayed.   No results for input(s): LIPASE, AMYLASE in the last 168 hours. No results for input(s): AMMONIA in the last 168 hours.  ABG    Component Value Date/Time   PHART 7.476 (H) 05/18/2020 1144   PCO2ART 33.3 05/18/2020 1144   PO2ART 114 (H) 05/18/2020 1144   HCO3 24.5 05/18/2020 1144   TCO2 25 05/18/2020 1144   ACIDBASEDEF 15.0 (H) 05/17/2020 1355   O2SAT 99.0 05/18/2020 1144  Coagulation Profile: Recent Labs  Lab 05/20/20 1830  INR 1.2    Cardiac Enzymes: Recent Labs  Lab 05/19/20 1122 05/22/20 0455  CKTOTAL 37,988* 5,748*    HbA1C: Hgb A1c MFr Bld  Date/Time Value Ref Range Status  05/17/2020 02:22 PM 5.5 4.8 - 5.6 % Final     Comment:    (NOTE) Pre diabetes:          5.7%-6.4%  Diabetes:              >6.4%  Glycemic control for   <7.0% adults with diabetes     CBG: Recent Labs  Lab 05/24/20 1059 05/24/20 1640 05/24/20 1932 05/24/20 2341 05/25/20 0349  GLUCAP 214* 185* 133* 159* 228*     Critical care time: 30 minutes    The patient is critically ill with multiple organ system failure and requires high complexity decision making for assessment and support, frequent evaluation and titration of therapies, advanced monitoring, review of radiographic studies and interpretation of complex data.    Heber Herrick, MD [place] Pager: [phone] Cell: [phone] If no response, call [phone]

## 2020-05-25 NOTE — Progress Notes (Signed)
Stephen Fisher Progress Note   49 y.o.malefoundunresponsive in bedby the spouse. Asystole andVT, arrest x2 with known minimum 10 minutes down time. SHock +positive for COVID19. CK was 37K and Troponin 9460 w/ a lactic acid >11 w/ WBC 31.8K, cocaine and ETOH positive. Patient was in florid shock + lactic acidosis and renal failure treated with broad spectrum abx, 3 pressors, steroids, Remdesivir, baricitinib and HCO3. Initial pH of blood gas was 6.8.  Assessment/ Plan:   1. Acute renal failure-secondary to Covid/sepsis.  Likelihood of survival very low.  Kidney function unlikely to improve unless overall clinical status changes.  No signs of renal recovery at this time with minimal to no urine output.  -Seen on CRRT.tolerating current ultrafiltration rate.  Continue with 2K bath given hyperkalemia -Increase dialysis flow rate to 2000cc to help with potassium clearance.  Likely more elevated today due to multiple clotting episodes over the past 24 hours -Heparin was stopped in the past, restart heparin today -Continue CRRT for now.  Continue goals of care with palliative care and primary team   2. COVID / septic shock -shock state improved but continuing to require ventilatory support 3. Cocaine positive -complicated by rhabdomyolysis. 4. S/p cardiac arrest: Severe illness as above 5. Hyperkalemia: Persistent.  Worsening over the past 24 hours due to multiple clotting episodes.  Continue 2K  Subjective:   Multiple clotting of CRRT over the past 24 hours.  Otherwise largely unchanged.  Goals of care ongoing.   Objective:   BP 103/60   Pulse (!) 111   Temp 98.2 F (36.8 C) (Axillary)   Resp (!) 28   Ht 6' (1.829 m)   Wt 91.4 kg   SpO2 91%   BMI 27.33 kg/m   Intake/Output Summary (Last 24 hours) at 05/25/2020 1010 Last data filed at 05/25/2020 0900 Gross per 24 hour  Intake 1606.06 ml  Output 2710 ml  Net -1103.94 ml   Weight change: -0.6 kg  Physical  Exam: GEN:On vent, sedated HEENT:No nasal discharge, dry mucous membranes LUNGS: Bilateral chest rise, ventilated CV: Normal rate ABD: Nondistended EXT:No lower extremity edema, warm Access:rtfemoral catheter  Imaging: No results found.  Labs: BMET Recent Labs  Lab 05/22/20 0455 05/22/20 1504 05/23/20 0415 05/23/20 1554 05/24/20 0430 05/24/20 1636 05/25/20 0420  NA 137 137 134* 137 133* 136 132*  K 4.9 5.2* 5.3* 5.5* 5.6* 5.3* 5.7*  CL 101 104 100 104 100 102 101  CO2 22 21* 20* 18* 18* 19* 17*  GLUCOSE 156* 160* 199* 199* 208* 177* 225*  BUN 70* 68* 74* 81* 86* 88* 86*  CREATININE 3.62* 3.50* 3.51* 3.33* 3.48* 3.60* 3.78*  CALCIUM 7.9* 8.0* 8.1* 7.8* 8.1* 8.3* 7.9*  PHOS 3.4 4.0 4.3 4.4 4.5 6.4* 6.3*   CBC Recent Labs  Lab 05/20/20 1830 05/21/20 0416 05/22/20 0455 05/23/20 0415 05/24/20 0430 05/25/20 0421  WBC 14.4*   < > 10.1 9.8 13.3* 18.8*  NEUTROABS 13.6*  --   --   --   --   --   HGB 12.2*   < > 12.0* 12.8* 14.2 13.6  HCT 37.2*   < > 35.7* 38.3* 41.7 41.4  MCV 84.0   < > 85.2 84.5 84.2 85.0  PLT 82*   < > 87* 85* 83* 106*   < > = values in this interval not displayed.    Medications:    . alteplase  2 mg Intracatheter Once  . baricitinib  1 mg Per Tube Daily  .  chlorhexidine gluconate (MEDLINE KIT)  15 mL Mouth Rinse BID  . Chlorhexidine Gluconate Cloth  6 each Topical Daily  . docusate  100 mg Per Tube BID  . feeding supplement (PROSource TF)  45 mL Per Tube TID  . folic acid  1 mg Per Tube Daily  . insulin aspart  0-20 Units Subcutaneous Q4H  . insulin glargine  20 Units Subcutaneous Daily  . lactulose  30 g Per Tube TID  . mouth rinse  15 mL Mouth Rinse 10 times per day  . methylPREDNISolone (SOLU-MEDROL) injection  1 mg/kg/day Intravenous Q12H  . pantoprazole (PROTONIX) IV  40 mg Intravenous Q12H  . sodium chloride flush  10-40 mL Intracatheter Q12H  . sodium chloride flush  3 mL Intravenous Q12H  . thiamine  100 mg Per Tube Daily  .  vitamin B-12  100 mcg Per Tube Daily      Stephen Fisher  05/25/2020, 10:10 AM

## 2020-05-25 NOTE — Plan of Care (Signed)
  Interdisciplinary Goals of Care Family Meeting   Date carried out:: 05/25/2020  Location of the meeting: Phone conference  Member's involved: Physician  Durable Power of Attorney or acting medical decision maker: Manufacturing engineer (son)    Discussion: We discussed goals of care for [employer] .  We discussed his poor prognosis and lack of progress in terms of mental status, respiratory or renal function.  They say that their father would not want to be maintained on life support in this situation and agree to withdraw care after visiting tomorrw.  Code status: Full DNR  Disposition: In-patient comfort care  Time spent for the meeting: 10 minutes  Heber [place] 05/25/2020, 5:20 PM

## 2020-05-25 NOTE — Progress Notes (Signed)
Removed tPA from HD ports and flushed.  Initiated CRRT at 1249 without any complications.  Initial ACT was collected and heparin was adjusted according to order.  Will continue to monitor patient.

## 2020-05-26 DIAGNOSIS — Z515 Encounter for palliative care: Secondary | ICD-10-CM

## 2020-05-26 LAB — GLUCOSE, CAPILLARY
Glucose-Capillary: 221 mg/dL — ABNORMAL HIGH (ref 70–99)
Glucose-Capillary: 242 mg/dL — ABNORMAL HIGH (ref 70–99)
Glucose-Capillary: 235 mg/dL — ABNORMAL HIGH (ref 70–99)
Glucose-Capillary: 250 mg/dL — ABNORMAL HIGH (ref 70–99)

## 2020-05-26 LAB — APTT: aPTT: 117 seconds — ABNORMAL HIGH (ref 24–36)

## 2020-05-26 LAB — POCT ACTIVATED CLOTTING TIME
Activated Clotting Time: 169 seconds
Activated Clotting Time: 180 seconds
Activated Clotting Time: 186 seconds
Activated Clotting Time: 191 seconds
Activated Clotting Time: 197 seconds
Activated Clotting Time: 202 seconds
Activated Clotting Time: 202 seconds
Activated Clotting Time: 219 seconds
Activated Clotting Time: 230 seconds
Activated Clotting Time: 230 seconds

## 2020-05-26 LAB — CBC
HCT: 40.1 % (ref 39.0–52.0)
Hemoglobin: 13.3 g/dL (ref 13.0–17.0)
MCH: 28.1 pg (ref 26.0–34.0)
MCHC: 33.2 g/dL (ref 30.0–36.0)
MCV: 84.6 fL (ref 80.0–100.0)
Platelets: 199 10*3/uL (ref 150–400)
RBC: 4.74 MIL/uL (ref 4.22–5.81)
RDW: 14.1 % (ref 11.5–15.5)
WBC: 28.5 10*3/uL — ABNORMAL HIGH (ref 4.0–10.5)
nRBC: 0 % (ref 0.0–0.2)

## 2020-05-26 LAB — RENAL FUNCTION PANEL
Albumin: 2.3 g/dL — ABNORMAL LOW (ref 3.5–5.0)
Anion gap: 13 (ref 5–15)
BUN: 85 mg/dL — ABNORMAL HIGH (ref 6–20)
CO2: 17 mmol/L — ABNORMAL LOW (ref 22–32)
Calcium: 7.7 mg/dL — ABNORMAL LOW (ref 8.9–10.3)
Chloride: 99 mmol/L (ref 98–111)
Creatinine, Ser: 3.61 mg/dL — ABNORMAL HIGH (ref 0.61–1.24)
GFR calc Af Amer: 22 mL/min — ABNORMAL LOW (ref >60)
GFR calc non Af Amer: 19 mL/min — ABNORMAL LOW (ref >60)
Glucose, Bld: 250 mg/dL — ABNORMAL HIGH (ref 70–99)
Phosphorus: 7.2 mg/dL — ABNORMAL HIGH (ref 2.5–4.6)
Potassium: 5.1 mmol/L (ref 3.5–5.1)
Sodium: 129 mmol/L — ABNORMAL LOW (ref 135–145)

## 2020-05-26 LAB — MAGNESIUM: Magnesium: 2.9 mg/dL — ABNORMAL HIGH (ref 1.7–2.4)

## 2020-05-26 MED ORDER — FENTANYL 2500MCG IN NS 250ML (10MCG/ML) PREMIX INFUSION: 0.0000 ug/h | INTRAVENOUS | Status: DC

## 2020-05-26 MED ORDER — BIOTENE DRY MOUTH MT LIQD: 15.0000 mL | OROMUCOSAL | Status: DC | PRN

## 2020-05-26 MED ORDER — ACETAMINOPHEN 650 MG RE SUPP: 650.0000 mg | Freq: Four times a day (QID) | RECTAL | Status: DC | PRN

## 2020-05-26 MED ORDER — BISACODYL 10 MG RE SUPP: 10.0000 mg | Freq: Every day | RECTAL | Status: DC | PRN

## 2020-05-26 MED ORDER — MIDAZOLAM 50MG/50ML (1MG/ML) PREMIX INFUSION
0.5000 mg/h | INTRAVENOUS | Status: DC
  Administered 2020-05-26: 2 mg/h via INTRAVENOUS
  Filled 2020-05-26: qty 50

## 2020-05-26 MED ORDER — MIDAZOLAM HCL 2 MG/2ML IJ SOLN: 1.0000 mg | INTRAMUSCULAR | Status: DC | PRN

## 2020-05-26 MED ORDER — MIDAZOLAM BOLUS VIA INFUSION
1.0000 mg | INTRAVENOUS | Status: DC | PRN
  Filled 2020-05-26: qty 2

## 2020-05-26 MED ORDER — FENTANYL BOLUS VIA INFUSION
25.0000 ug | INTRAVENOUS | Status: DC | PRN
  Filled 2020-05-26: qty 100

## 2020-05-26 MED ORDER — FENTANYL CITRATE (PF) 100 MCG/2ML IJ SOLN: 25.0000 ug | INTRAMUSCULAR | Status: DC | PRN

## 2020-05-26 MED ORDER — GLYCOPYRROLATE 0.2 MG/ML IJ SOLN: 0.4000 mg | INTRAMUSCULAR | Status: DC

## 2020-05-26 MED ORDER — ACETAMINOPHEN 325 MG PO TABS: 650.0000 mg | ORAL_TABLET | Freq: Four times a day (QID) | ORAL | Status: DC | PRN

## 2020-05-26 MED ORDER — ONDANSETRON 4 MG PO TBDP: 4.0000 mg | ORAL_TABLET | Freq: Four times a day (QID) | ORAL | Status: DC | PRN

## 2020-05-26 MED ORDER — ONDANSETRON HCL 4 MG/2ML IJ SOLN: 4.0000 mg | Freq: Four times a day (QID) | INTRAMUSCULAR | Status: DC | PRN

## 2020-05-26 MED ORDER — POLYVINYL ALCOHOL 1.4 % OP SOLN
1.0000 [drp] | Freq: Four times a day (QID) | OPHTHALMIC | Status: DC | PRN
  Filled 2020-05-26: qty 15

## 2020-05-26 NOTE — Progress Notes (Signed)
Patient deceased, pronounced by this RN and Tessa Lerner, RN at [phone]. Son [place] notified of death. Patient placement and Elink MD notified. Postmortem checklist completed. Patient transported to morgue at 2215.

## 2020-05-26 NOTE — Progress Notes (Signed)
Nutrition Brief Note  Chart reviewed. Pt transitioning to comfort care today.  No further nutrition interventions warranted at this time.  Please re-consult as needed.   Romelle Starcher MS, RDN, LDN, CNSC Registered Dietitian III Clinical Nutrition RD Pager and On-Call Pager Number Located in [place]

## 2020-05-26 NOTE — Progress Notes (Signed)
[facility] Progress Note   49 y.o.malefoundunresponsive in bedby the spouse. Asystole andVT, arrest x2 with known minimum 10 minutes down time. SHock +positive for COVID19. CK was 37K and Troponin 9460 w/ a lactic acid >11 w/ WBC 31.8K, cocaine and ETOH positive. Patient was in florid shock + lactic acidosis and renal failure treated with broad spectrum abx, 3 pressors, steroids, Remdesivir, baricitinib and HCO3. Initial pH of blood gas was 6.8.  Assessment/ Plan:   1. Acute renal failure-secondary to Covid/sepsis.  Likelihood of survival very low.  Kidney function unlikely to improve unless overall clinical status changes.  No signs of renal recovery at this time with minimal to no urine output.  -Seen on CRRT.tolerating current ultrafiltration rate.  Continue with 2K bath given hyperkalemia -Continue heparin and current dialysis flow rate -CRRT can be stopped after the family visit or when the circuit clots   2. COVID / septic shock -shock state improved but continuing to require ventilatory support 3. Cocaine positive -complicated by rhabdomyolysis. 4. S/p cardiac arrest: Severe illness as above 5. Hyperkalemia: Acceptable at 5.1. No changes  Subjective:   No changes over the past 24 hours. After discussions with family plan is for comfort care after they are able to see him this evening based on discussion with nurse.   Objective:   BP (!) 125/103   Pulse (!) 101   Temp 97.9 F (36.6 C) (Axillary)   Resp 15   Ht 6' (1.829 m)   Wt 86.4 kg   SpO2 98%   BMI 25.83 kg/m   Intake/Output Summary (Last 24 hours) at 24-Jun-2020 1215 Last data filed at 2020/06/24 1200 Gross per 24 hour  Intake 2200.17 ml  Output 3538 ml  Net -1337.83 ml   Weight change: -5 kg  Physical Exam: GEN:On vent, sedated HEENT:No nasal discharge, dry mucous membranes LUNGS: Bilateral chest rise, ventilated CV: Normal rate ABD: Nondistended EXT:No lower extremity edema,  warm Access:rtfemoral catheter  Imaging: No results found.  Labs: BMET Recent Labs  Lab 05/23/20 0415 05/23/20 1554 05/24/20 0430 05/24/20 1636 05/25/20 0420 05/25/20 2022 06/24/2020 0415  NA 134* 137 133* 136 132* 133* 129*  K 5.3* 5.5* 5.6* 5.3* 5.7* 5.1 5.1  CL 100 104 100 102 101 102 99  CO2 20* 18* 18* 19* 17* 18* 17*  GLUCOSE 199* 199* 208* 177* 225* 195* 250*  BUN 74* 81* 86* 88* 86* 91* 85*  CREATININE 3.51* 3.33* 3.48* 3.60* 3.78* 3.92* 3.61*  CALCIUM 8.1* 7.8* 8.1* 8.3* 7.9* 7.6* 7.7*  PHOS 4.3 4.4 4.5 6.4* 6.3* 6.2* 7.2*   CBC Recent Labs  Lab 05/20/20 1830 05/21/20 0416 05/23/20 0415 05/24/20 0430 05/25/20 0421 Jun 24, 2020 0415  WBC 14.4*   < > 9.8 13.3* 18.8* 28.5*  NEUTROABS 13.6*  --   --   --   --   --   HGB 12.2*   < > 12.8* 14.2 13.6 13.3  HCT 37.2*   < > 38.3* 41.7 41.4 40.1  MCV 84.0   < > 84.5 84.2 85.0 84.6  PLT 82*   < > 85* 83* 106* 199   < > = values in this interval not displayed.    Medications:    . baricitinib  1 mg Per Tube Daily  . chlorhexidine gluconate (MEDLINE KIT)  15 mL Mouth Rinse BID  . Chlorhexidine Gluconate Cloth  6 each Topical Daily  . docusate  100 mg Per Tube BID  . feeding supplement (PROSource TF)  45 mL Per Tube TID  . folic acid  1 mg Per Tube Daily  . insulin aspart  0-20 Units Subcutaneous Q4H  . insulin glargine  20 Units Subcutaneous Daily  . lactulose  30 g Per Tube TID  . mouth rinse  15 mL Mouth Rinse 10 times per day  . methylPREDNISolone (SOLU-MEDROL) injection  1 mg/kg/day Intravenous Q12H  . pantoprazole (PROTONIX) IV  40 mg Intravenous Q12H  . sodium chloride flush  10-40 mL Intracatheter Q12H  . sodium chloride flush  3 mL Intravenous Q12H  . thiamine  100 mg Per Tube Daily  . vitamin B-12  100 mcg Per Tube Daily      Reesa Chew  06/10/20, 12:15 PM

## 2020-05-26 NOTE — Procedures (Signed)
Extubation Procedure Note  Patient Details:   Name: Stephen Fisher DOB: 1970-11-07 MRN: [id number]   Airway Documentation:    Vent end date: 2020/05/31 Vent end time: 1825   Evaluation  O2 sats: currently acceptable Complications: Complications of terminal extubation to comfort care Patient did tolerate procedure well. Bilateral Breath Sounds: Diminished   No   Patient extubated to comfort care per order and family wishes. RN is at bedside. RT to continue to monitor.   Crystall Donaldson Lajuana Ripple 05-31-2020, 6:46 PM

## 2020-05-26 NOTE — Progress Notes (Addendum)
Palliative Medicine Inpatient Follow Up Note   HPI: 49 y.o.malewith unknown past medical history [facility]21status post cardiac arrest.Per fiancee, patient was drinking last night when they got into an argument and he left the house around midnight. He returned home the next morning, they continued drinking and then he passed out around lunch time. When she came back hours later, he was snoring and she was unable to wake him up so she called EMS. Upon EMS arrival, patient was pulseless in asystole. ROSC after estimated 10 minutes, one episode of V-tach requiring defibrillation. ROSC was achieved briefly before loss of pulse again. Per EMS, 3 rounds epi given in route. CPR ongoing on arrival to ED. ROSC achieved briefly after arrival.   Palliative care was asked to help with goals of care conversations.  Today's Discussion (06/08/20): Chart reviewed. Dr. Kendrick Fries spoke to patients son, Stephen Fisher yesterday. Plan for compassionate extubation this afternoon.  I assessed Stephen Fisher he remains to be intubated, on CRRT, and not making any purposeful movements or following any commands.   Touched base with covering RN.  Reached out to patients son, Stephen Fisher. He asked if we could have a spiritual provider come by this afternoon to pray with them prior to liberation from ventilatory support.  Visitation policy for Covid ICU end of life patients 2 visitors for 15 minute intervals  Questions and concerns addressed   SUMMARY OF RECOMMENDATIONS   DNR  Plan for liberation from ventilation this afternoon and transition of focus to comfort oriented care  Request for spiritual support  Discharge will be celestial in hours to days  Discussed with Dr. Kendrick Fries  Time Spent: 25 Greater than 50% of the time was spent in counseling and coordination of care ______________________________________________________________________________________ Addendum: Family  visited with patient this evening. Orders placed for transition to comfort oriented care.  We talked about transition to comfort measures in house and what that would entail inclusive of stopping CRRT and extubating from ventilatory support. Discussed that we utilize medications to control pain, dyspnea, agitation, nausea, itching, and hiccups. We discussed stopping all uneccessary measures such as blood draws, needle sticks, and frequent vital signs.    Family w. Questions regarding movements to nursing who kindly informed them that these are involuntary and not a sign of the ability to comprehend what is presently occurring.   Ongoing education and encouragement to ask questions regarding the end of life process.  Staff have been doing a wonderful job to aid in education with the patients family.   End of Life Care: Dyspnea: Pain:  - Fentanyl gtt w/ boluses Fever:  - Tylenol 650mg  PO/PR Q6H PRN Agitation: Anxiety:  - Midazolam gtt w/ boluses Nausea:  - Zofran 4mg  SL/IV Q6H PRN Reflux:  - Protonix 40mg  IVP Q12H Secretions:  - Glycopyrrolate 0.4mg  IV Q4H ATC Dry Eyes:  - Artificial Tears PRN Xerostomia:  - Biotene 33ml PRN  - BID oral care Urinary Retention:  - Maintain foley Constipation:   - Bisacodyl 10mg  PR PRN QDay Spiritual:  - Chaplain consult  I was able to help support family and nursing during this time. Five family members visited for 15 minute intervals. I was able to see and speak with them and answer questions as they arose.   Additional Time Spent: 80 minutes Greater than 50% of the time was spent in counseling and coordination of care  [facility] Palliative Medicine Team Team Cell Phone: [phone] Please utilize secure chat  with additional questions, if there is no response within 30 minutes please call the above phone number  Palliative Medicine Team providers are available by phone from 7am to 7pm daily and can be reached through  the team cell phone.  Should this patient require assistance outside of these hours, please call the patient's attending physician.

## 2020-05-26 NOTE — Progress Notes (Signed)
NAME:  Stephen Fisher, MRN:  [id number], DOB:  04-15-1971, LOS: 10 ADMISSION DATE:  June 11, 2020  Brief history  49 y/o male had an out of hospital cardiac arrest, at least 10 minutes.  Has evidence of anoxic brain injury on MRI brain and exam.  COVID postive.  Significant Hospital Events   8/15 discussion with family re severe anoxic brain injury on MRI, decision made to change code status to DNR, palliative medicine consulted  Consults:  PCCM palliative  Procedures:  ETT 8/10 >> 8/11>> Femoral CVC 8/11>> Femoral a-line  Significant Diagnostic Tests:  CT Head 8/10 > ? PRES CXR 8/10 > ? Upper lobe PNA, presumed 2/2 COVID 8/11 Echo> LVEF 65-70%, moderate LV hypertrophy, RV function normal 8/14 MRI Brain> severe diffuse anoxic brain injury noted  Micro Data:  8/12 MRSA PCR negative 8/10 Blood Cx negative  Antimicrobials/COVID Rx  Zosyn 8/10 >8/14 Ceftriaxone 8/14 with planned stop time 8/17  8/11 baricitinib >   Interim history/subjective:   No acute events  Objective   Blood pressure (!) 140/110, pulse (!) 110, temperature 98.4 F (36.9 C), temperature source Axillary, resp. rate (!) 21, height 6' (1.829 m), weight 86.4 kg, SpO2 100 %.    Vent Mode: SIMV;PSV;PCV FiO2 (%):  [30 %-40 %] 40 % Set Rate:  [10 bmp-28 bmp] 10 bmp Vt Set:  [620 mL] 620 mL PEEP:  [5 cmH20] 5 cmH20 Pressure Support:  [12 cmH20] 12 cmH20 Plateau Pressure:  [14 cmH20-30 cmH20] 14 cmH20   Intake/Output Summary (Last 24 hours) at 06/02/2020 0715 Last data filed at 05/25/2020 0700 Gross per 24 hour  Intake 2321.72 ml  Output 2555 ml  Net -233.28 ml   Filed Weights   05/24/20 0442 05/25/20 0415 06/04/2020 0410  Weight: 92 kg 91.4 kg 86.4 kg    General:  In bed on vent HENT: NCAT ETT in place PULM: CTA B, vent supported breathing CV: RRR, no mgr GI: BS+, soft, nontender MSK: normal bulk and tone Neuro: eyes open, some flex response to pain   Resolved Hospital Problem list      Assessment & Plan:  Shock with multiorgan failure> shock resolved Continue to monitor hemodynamics  Acute Hypoxemic Respiratory Failure Full mechanical vent support VAP prevention Hold SBT as no improvement  AKI  Hold CRRT today  Coagulopathy Monitor for bleeding Transfuse PRBC for Hgb < 7 gm/dL   Hyperglycemia SSI, lantus 20 Units, monitor glucose  Hypertension Labetalol prn  Severe anoxic brain injury > exam and MRI findings consistent with poor prognosis Myoclonic jerking Low dose fentanyl infusion to help with vent synchrony until time of withdrawal of care.  At that point comfort measures order set  Disposition: family to visit today, plan for withdrawal of care  Best practice:  Diet: Tube Feeds Pain/Anxiety/Delirium protocol (if indicated):  RASS goal 0 VAP protocol (if indicated):  In place. DVT prophylaxis: Heparin (on hold due to bleeding)  GI prophylaxis: Pepcid  Glucose control: SSI  Mobility: Bedrest Code Status: DNR Family Communication:see note from 8/19 Disposition: ICU  Labs   CBC: Recent Labs  Lab 05/20/20 1830 05/21/20 0416 05/22/20 0455 05/23/20 0415 05/24/20 0430 05/25/20 0421 05/21/2020 0415  WBC 14.4*   < > 10.1 9.8 13.3* 18.8* 28.5*  NEUTROABS 13.6*  --   --   --   --   --   --   HGB 12.2*   < > 12.0* 12.8* 14.2 13.6 13.3  HCT 37.2*   < > 35.7*  38.3* 41.7 41.4 40.1  MCV 84.0   < > 85.2 84.5 84.2 85.0 84.6  PLT 82*   < > 87* 85* 83* 106* 199   < > = values in this interval not displayed.    Basic Metabolic Panel: Recent Labs  Lab 05/22/20 0455 05/22/20 1504 05/23/20 0415 05/23/20 1554 05/24/20 0430 05/24/20 1636 05/25/20 0420 05/25/20 2022 06-23-2020 0415  NA 137   < > 134*   < > 133* 136 132* 133* 129*  K 4.9   < > 5.3*   < > 5.6* 5.3* 5.7* 5.1 5.1  CL 101   < > 100   < > 100 102 101 102 99  CO2 22   < > 20*   < > 18* 19* 17* 18* 17*  GLUCOSE 156*   < > 199*   < > 208* 177* 225* 195* 250*  BUN 70*   < > 74*   < >  86* 88* 86* 91* 85*  CREATININE 3.62*   < > 3.51*   < > 3.48* 3.60* 3.78* 3.92* 3.61*  CALCIUM 7.9*   < > 8.1*   < > 8.1* 8.3* 7.9* 7.6* 7.7*  MG 2.7*  --  2.8*  --  2.8*  --  2.8*  --  2.9*  PHOS 3.4   < > 4.3   < > 4.5 6.4* 6.3* 6.2* 7.2*   < > = values in this interval not displayed.   GFR: Estimated Creatinine Clearance: 27.8 mL/min (A) (by C-G formula based on SCr of 3.61 mg/dL (H)). Recent Labs  Lab 05/19/20 1107 05/19/20 1726 05/20/20 0416 05/20/20 1359 05/20/20 1830 05/23/20 0415 05/24/20 0430 05/25/20 0421 Jun 23, 2020 0415  WBC  --   --    < >  --    < > 9.8 13.3* 18.8* 28.5*  LATICACIDVEN 3.9* 4.3*  --  3.1*  --   --   --   --   --    < > = values in this interval not displayed.    Liver Function Tests: Recent Labs  Lab 05/20/20 1508 05/21/20 0416 05/24/20 0430 05/24/20 1636 05/25/20 0420 05/25/20 2022 06-23-20 0415  AST 1,971*  --   --   --   --   --   --   ALT 2,703*  --   --   --   --   --   --   ALKPHOS 85  --   --   --   --   --   --   BILITOT 3.3*  --   --   --   --   --   --   PROT 4.5*  --   --   --   --   --   --   ALBUMIN 2.0*  2.0*   < > 2.1* 2.3* 2.1* 2.2* 2.3*   < > = values in this interval not displayed.   No results for input(s): LIPASE, AMYLASE in the last 168 hours. No results for input(s): AMMONIA in the last 168 hours.  ABG    Component Value Date/Time   PHART 7.476 (H) 05/18/2020 1144   PCO2ART 33.3 05/18/2020 1144   PO2ART 114 (H) 05/18/2020 1144   HCO3 24.5 05/18/2020 1144   TCO2 25 05/18/2020 1144   ACIDBASEDEF 15.0 (H) 05/17/2020 1355   O2SAT 99.0 05/18/2020 1144     Coagulation Profile: Recent Labs  Lab 05/20/20 1830  INR 1.2    Cardiac Enzymes: Recent  Labs  Lab 05/19/20 1122 05/22/20 0455  CKTOTAL 37,988* 5,748*    HbA1C: Hgb A1c MFr Bld  Date/Time Value Ref Range Status  05/17/2020 02:22 PM 5.5 4.8 - 5.6 % Final    Comment:    (NOTE) Pre diabetes:          5.7%-6.4%  Diabetes:               >6.4%  Glycemic control for   <7.0% adults with diabetes     CBG: Recent Labs  Lab 05/25/20 1119 05/25/20 1513 05/25/20 1917 05/25/20 2339 06/04/2020 0339  GLUCAP 251* 213* 178* 179* 221*     Critical care time: 30 minutes    The patient is critically ill with multiple organ system failure and requires high complexity decision making for assessment and support, frequent evaluation and titration of therapies, advanced monitoring, review of radiographic studies and interpretation of complex data.    Heber [place], MD [place] [facility] Pager: [phone] Cell: [phone] If no response, call [phone]

## 2020-06-07 NOTE — Death Summary Note (Signed)
DEATH SUMMARY   Patient Details  Name: Stephen Fisher MRN: [id number] DOB: 20-Apr-1971  Admission/Discharge Information   Admit Date:  06-02-2020  Date of Death: Date of Death: Jun 12, 2020  Time of Death: Time of Death: 1934  Length of Stay: 03-Mar-2023  Referring Physician: Patient, No Pcp Per   Reason(s) for Hospitalization  Cardiac arrest  Diagnoses  Preliminary cause of death:   COVID 03-12-23 Secondary Diagnoses (including complications and co-morbidities):  Active Problems:   Cardiac arrest (HCC)   Acute respiratory failure (HCC)   Alcoholic intoxication with complication (HCC)   Anoxic brain injury (HCC)   Goals of care, counseling/discussion   Palliative care by specialist   Terminal care   Brief Hospital Course (including significant findings, care, treatment, and services provided and events leading to death)  Stephen Fisher is a 39 y.o. year old male who was admitted on Jun 03, 2023 after a cardiac arrest.  He was noted to be COVID positive.  He was intubated, required mechanical ventilation.  Head imaging showed severe anoxic brain injury.  He was treated with standard therapy for COVID 19 including steroids, baricitinib, remdesivir.  Despite aggressive treatment his neurologic condition did not improve.  We had multiple conversations with the patients family and they elected to withdraw care.    Pertinent Labs and Studies  Significant Diagnostic Studies DG Chest 1 View  Result Date: 02-Jun-2020 CLINICAL DATA:  49 year old male status post intubation. EXAM: CHEST  1 VIEW COMPARISON:  None. FINDINGS: Endotracheal tube with tip approximately 4.5 cm above the carina. Enteric tube extends below the diaphragm with tip in the proximal stomach. Bilateral upper lobe predominant streaky and nodular densities most consistent with pneumonia. Clinical correlation is recommended. No pleural effusion or pneumothorax. Mild cardiomegaly. No acute osseous pathology. IMPRESSION: 1. Endotracheal  tube above the carina. 2. Bilateral upper lobe predominant pneumonia. Clinical correlation is recommended. Electronically Signed   By: Elgie Collard M.D.   On: June 02, 2020 19:24   CT Head Wo Contrast  Result Date: 05/17/2020 CLINICAL DATA:  Mental status change and weakness. EXAM: CT HEAD WITHOUT CONTRAST TECHNIQUE: Contiguous axial images were obtained from the base of the skull through the vertex without intravenous contrast. COMPARISON:  None. 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 Recommend MRI brain for further evaluation. The ventricles are in the midline without mass effect or shift. No extra-axial fluid collections are identified. No CT findings for hemispheric infarction or intracranial hemorrhage. Vascular: No hyperdense vessels or aneurysm. Skull: No skull fracture or bone lesions. Sinuses/Orbits: Pansinusitis. The mastoid air cells and middle ear cavities are clear. The globes are intact. Other: No scalp lesions or scalp hematoma. IMPRESSION: 1. 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 Recommend MRI brain for further evaluation. 2. No findings for infarction, hemorrhage or mass. 3. Pansinusitis. Electronically Signed   By: Rudie Meyer M.D.   On: 05/17/2020 05:27   MR BRAIN WO CONTRAST  Result Date: 05/20/2020 CLINICAL DATA:  Cardiac arrest EXAM: MRI HEAD WITHOUT CONTRAST TECHNIQUE: Multiplanar, multiecho pulse sequences of the brain and surrounding structures were obtained without intravenous contrast. COMPARISON:  None. FINDINGS: Brain: There is diffuse cortical reduced diffusion. There is also involvement of the basal ganglia, hippocampus, and cerebellum. There is corresponding T2 hyperintensity. No significant mass effect. No hydrocephalus. There is no evidence of  hemorrhage. No extra-axial fluid collection. Vascular: Major vessel flow voids  at the skull base are preserved. Skull and upper cervical spine: Normal marrow signal is preserved. Sinuses/Orbits: Nonspecific paranasal sinus mucosal thickening. Orbits are unremarkable. Other: Sella is unremarkable. Nonspecific mastoid fluid opacification. IMPRESSION: Severe hypoxic/ischemic injury. Electronically Signed   By: Guadlupe Spanish M.D.   On: 05/20/2020 13:45   DG Chest Port 1 View  Result Date: 05/18/2020 CLINICAL DATA:  Respiratory failure EXAM: PORTABLE CHEST 1 VIEW COMPARISON:  05/17/2020, 06-03-20 FINDINGS: Endotracheal tube tip is about 4.5 cm superior to the carina. Esophageal tube tip in the left upper quadrant. Right-sided central venous catheter tip over the SVC. Minimal left perihilar airspace opacity. Diffuse hazy appearance of right thorax with right pleural effusion. This finding is grossly unchanged. IMPRESSION: No significant interval change in diffuse hazy asymmetric appearance of the right thorax potentially due to layering pleural effusion versus asymmetric edema or pneumonia in the right thorax. Mild left perihilar infiltrates are unchanged. Electronically Signed   By: Jasmine Pang M.D.   On: 05/18/2020 04:00   DG CHEST PORT 1 VIEW  Result Date: 05/17/2020 CLINICAL DATA:  Acute respiratory failure EXAM: PORTABLE CHEST 1 VIEW COMPARISON:  Jun 03, 2020 FINDINGS: Endotracheal tube, enteric tube, and right IJ central line are unchanged. Patchy bilateral opacities, right greater than left remain present. No significant pleural effusion. No pneumothorax. Stable cardiomediastinal contours. IMPRESSION: Stable lines and tubes. No substantial change in patchy bilateral opacities, right greater than left. Electronically Signed   By: Guadlupe Spanish M.D.   On: 05/17/2020 15:08   DG Chest Portable 1 View  Result Date: 06/03/2020 CLINICAL DATA:  49 year old male with central line placement. EXAM: PORTABLE  CHEST 1 VIEW COMPARISON:  Earlier radiograph dated 03-Jun-2020. FINDINGS: Interval placement of a right IJ central venous line with tip over central SVC. No pneumothorax. Endotracheal tube remains above the carina and enteric tube extends below the diaphragm. Interval worsening of bilateral pulmonary densities, right greater left. No large pleural effusion. Stable cardiac silhouette. No acute osseous pathology. IMPRESSION: 1. Interval placement of a right IJ central venous line with tip over central SVC. No pneumothorax. 2. Interval worsening of the bilateral pulmonary densities. Electronically Signed   By: Elgie Collard M.D.   On: 06-03-2020 21:37   ECHOCARDIOGRAM COMPLETE  Result Date: 05/17/2020    ECHOCARDIOGRAM REPORT   Patient Name:   ZIDANE RENNER Date of Exam: 05/17/2020 Medical Rec #:  [id number]            Height:       72.0 in Accession #:    [id number]           Weight:       220.0 lb Date of Birth:  1971-03-05            BSA:          2.219 m Patient Age:    49 years             BP:           112/95 mmHg Patient Gender: M                    HR:           115 bpm. Exam Location:  Inpatient Procedure: 2D Echo Indications:    cardiac arrest I46.9  History:        Patient has no prior history of Echocardiogram examinations. No                 prior  cardiac hx on file. cocaine & ETOH use per HX.  Sonographer:    Celene SkeenVijay Shankar RDCS (AE) Referring Phys: [id number] Tobey GrimKATALINA M EUBANKS IMPRESSIONS  1. Left ventricular ejection fraction, by estimation, is 65 to 70%. The left ventricle has hyperdynamic function. The left ventricle has no regional wall motion abnormalities. There is moderate left ventricular hypertrophy. Left ventricular diastolic parameters are indeterminate.  2. Right ventricular systolic function is normal. The right ventricular size is normal. Tricuspid regurgitation signal is inadequate for assessing PA pressure.  3. The mitral valve is normal in structure. No evidence of mitral valve  regurgitation. No evidence of mitral stenosis.  4. The aortic valve is tricuspid. Aortic valve regurgitation is not visualized. No aortic stenosis is present.  5. The inferior vena cava is normal in size with greater than 50% respiratory variability, suggesting right atrial pressure of 3 mmHg.  6. Technically difficult study with poor acoustic windows. FINDINGS  Left Ventricle: Left ventricular ejection fraction, by estimation, is 65 to 70%. The left ventricle has hyperdynamic function. The left ventricle has no regional wall motion abnormalities. The left ventricular internal cavity size was normal in size. There is moderate left ventricular hypertrophy. Left ventricular diastolic parameters are indeterminate. Right Ventricle: The right ventricular size is normal. No increase in right ventricular wall thickness. Right ventricular systolic function is normal. Tricuspid regurgitation signal is inadequate for assessing PA pressure. Left Atrium: Left atrial size was normal in size. Right Atrium: Right atrial size was normal in size. Pericardium: There is no evidence of pericardial effusion. Mitral Valve: The mitral valve is normal in structure. No evidence of mitral valve regurgitation. No evidence of mitral valve stenosis. Tricuspid Valve: The tricuspid valve is not well visualized. Tricuspid valve regurgitation is not demonstrated. Aortic Valve: The aortic valve is tricuspid. Aortic valve regurgitation is not visualized. No aortic stenosis is present. Pulmonic Valve: The pulmonic valve was not well visualized. Pulmonic valve regurgitation is not visualized. Aorta: The aortic root is normal in size and structure. Venous: The inferior vena cava is normal in size with greater than 50% respiratory variability, suggesting right atrial pressure of 3 mmHg. IAS/Shunts: No atrial level shunt detected by color flow Doppler.  LEFT VENTRICLE PLAX 2D LVIDd:         3.84 cm LVIDs:         2.07 cm LV PW:         1.23 cm LV IVS:         1.62 cm LVOT diam:     2.60 cm LV SV:         75 LV SV Index:   34 LVOT Area:     5.31 cm  RIGHT VENTRICLE RV S prime:     14.80 cm/s TAPSE (M-mode): 1.5 cm LEFT ATRIUM         Index LA diam:    2.50 cm 1.13 cm/m  AORTIC VALVE LVOT Vmax:   130.00 cm/s LVOT Vmean:  60.500 cm/s LVOT VTI:    0.142 m  AORTA Ao Root diam: 3.40 cm MITRAL VALVE MV Area (PHT): 3.99 cm    SHUNTS MV Decel Time: 190 msec    Systemic VTI:  0.14 m MV E velocity: 41.60 cm/s  Systemic Diam: 2.60 cm MV A velocity: 54.70 cm/s MV E/A ratio:  0.76 Marca Anconaalton Mclean MD Electronically signed by Marca Anconaalton Mclean MD Signature Date/Time: 05/17/2020/7:30:52 PM    Final    VAS US LOWER EXTREMITY VENOUS (DVT)  Result Date: 05/17/2020  Lower Venous DVTStudy Indications: Swelling, Edema, and elevated ddimer.  Limitations: Bandages and line. Comparison Study: no prior Performing Technologist: Blanch Media RVS  Examination Guidelines: A complete evaluation includes B-mode imaging, spectral Doppler, color Doppler, and power Doppler as needed of all accessible portions of each vessel. Bilateral testing is considered an integral part of a complete examination. Limited examinations for reoccurring indications may be performed as noted. The reflux portion of the exam is performed with the patient in reverse Trendelenburg.  +---------+---------------+---------+-----------+----------+--------------+ RIGHT    CompressibilityPhasicitySpontaneityPropertiesThrombus Aging +---------+---------------+---------+-----------+----------+--------------+ CFV                                                   Not visualized +---------+---------------+---------+-----------+----------+--------------+ SFJ                                                   Not visualized +---------+---------------+---------+-----------+----------+--------------+ FV Prox                                               Not visualized  +---------+---------------+---------+-----------+----------+--------------+ FV Mid   Full                                                        +---------+---------------+---------+-----------+----------+--------------+ FV DistalFull           Yes                                          +---------+---------------+---------+-----------+----------+--------------+ PFV                                                   Not visualized +---------+---------------+---------+-----------+----------+--------------+ POP      Full           Yes      Yes                                 +---------+---------------+---------+-----------+----------+--------------+ PTV      Full                                                        +---------+---------------+---------+-----------+----------+--------------+ PERO     Full                                                        +---------+---------------+---------+-----------+----------+--------------+   +---------+---------------+---------+-----------+----------+--------------+  LEFT     CompressibilityPhasicitySpontaneityPropertiesThrombus Aging +---------+---------------+---------+-----------+----------+--------------+ CFV      Full           Yes      Yes                                 +---------+---------------+---------+-----------+----------+--------------+ SFJ      Full                                                        +---------+---------------+---------+-----------+----------+--------------+ FV Prox  Full                                                        +---------+---------------+---------+-----------+----------+--------------+ FV Mid   Full                                                        +---------+---------------+---------+-----------+----------+--------------+ FV DistalFull                                                         +---------+---------------+---------+-----------+----------+--------------+ PFV      Full                                                        +---------+---------------+---------+-----------+----------+--------------+ POP      Full           Yes      Yes                                 +---------+---------------+---------+-----------+----------+--------------+ PTV      Full                                                        +---------+---------------+---------+-----------+----------+--------------+ PERO     Full                                                        +---------+---------------+---------+-----------+----------+--------------+     Summary: BILATERAL: - No evidence of superficial venous thrombosis in the lower extremities, bilaterally. - RIGHT: - There is no evidence of deep vein thrombosis in the lower extremity. However, portions of this examination were limited- see technologist comments above.  LEFT: -  There is no evidence of deep vein thrombosis in the lower extremity.  *See table(s) above for measurements and observations. Electronically signed by Gretta Began MD on 05/17/2020 at 10:28:24 PM.    Final    VAS [place] UPPER EXTREMITY VENOUS DUPLEX  Result Date: 05/21/2020 UPPER VENOUS STUDY  Indications: Swelling, and Covid-19 Limitations: Line, bandages and significant edema. Comparison Study: No prior study Performing Technologist: Sherren Kerns RVS  Examination Guidelines: A complete evaluation includes B-mode imaging, spectral Doppler, color Doppler, and power Doppler as needed of all accessible portions of each vessel. Bilateral testing is considered an integral part of a complete examination. Limited examinations for reoccurring indications may be performed as noted.  Right Findings: +----------+------------+---------+-----------+----------+---------------+ RIGHT     CompressiblePhasicitySpontaneousProperties    Summary      +----------+------------+---------+-----------+----------+---------------+ IJV                                                 Not visualized  +----------+------------+---------+-----------+----------+---------------+ Subclavian               Yes       Yes                              +----------+------------+---------+-----------+----------+---------------+ Axillary      Full       Yes       Yes                              +----------+------------+---------+-----------+----------+---------------+ Brachial      Full       Yes       Yes                              +----------+------------+---------+-----------+----------+---------------+ Radial                                              patent by color +----------+------------+---------+-----------+----------+---------------+ Ulnar                                               Not visualized  +----------+------------+---------+-----------+----------+---------------+ Cephalic                                            Not visualized  +----------+------------+---------+-----------+----------+---------------+ Basilic                                             Not visualized  +----------+------------+---------+-----------+----------+---------------+  Left Findings: +----------+------------+---------+-----------+----------+--------------+ LEFT      CompressiblePhasicitySpontaneousProperties   Summary     +----------+------------+---------+-----------+----------+--------------+ Subclavian  Not visualized +----------+------------+---------+-----------+----------+--------------+  Summary:  Right: No obvious evidence of DVT noted in the visualized veins of the right upper extremity. Unable to visualize the IJV, cephalic, basilic, and ulnar veins not visualized. Significant interstitial fluid noted throughout the upper arm.  *See table(s) above for measurements  and observations.  Diagnosing physician: Fabienne Bruns MD Electronically signed by Fabienne Bruns MD on 05/21/2020 at 12:27:37 PM.    Final     Microbiology No results found for this or any previous visit (from the past 240 hour(s)).  Lab Basic Metabolic Panel: No results for input(s): NA, K, CL, CO2, GLUCOSE, BUN, CREATININE, CALCIUM, MG, PHOS in the last 168 hours. Liver Function Tests: No results for input(s): AST, ALT, ALKPHOS, BILITOT, PROT, ALBUMIN in the last 168 hours. No results for input(s): LIPASE, AMYLASE in the last 168 hours. No results for input(s): AMMONIA in the last 168 hours. CBC: No results for input(s): WBC, NEUTROABS, HGB, HCT, MCV, PLT in the last 168 hours. Cardiac Enzymes: No results for input(s): CKTOTAL, CKMB, CKMBINDEX, TROPONINI in the last 168 hours. Sepsis Labs: No results for input(s): PROCALCITON, WBC, LATICACIDVEN in the last 168 hours.  Procedures/Operations  ETT 8/10 >> 8/11>> Femoral CVC 8/11>> Femoral a-line   [place] 06/05/2020, 12:41 PM

## 2020-06-07 DEATH — deceased

## 2022-08-07 IMAGING — CT CT HEAD W/O CM
3 series · 16 of 47 positions shown, 19 images · non-contrast
Comparison: None.

CLINICAL DATA: Mental status change and weakness.

EXAM:
CT HEAD WITHOUT CONTRAST
TECHNIQUE: Contiguous axial images were obtained from the base of the skull
through the vertex without intravenous contrast.

[Series 3: head 5.0 h30s · axial · 0.48mm/px · z∈[-139,+26]mm · 10 of 39 slices shown, 13 images]
[im 3/39  brain]
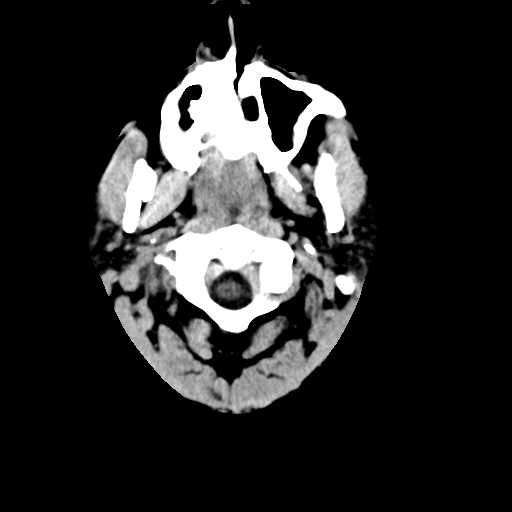
[im 3/39  bone]
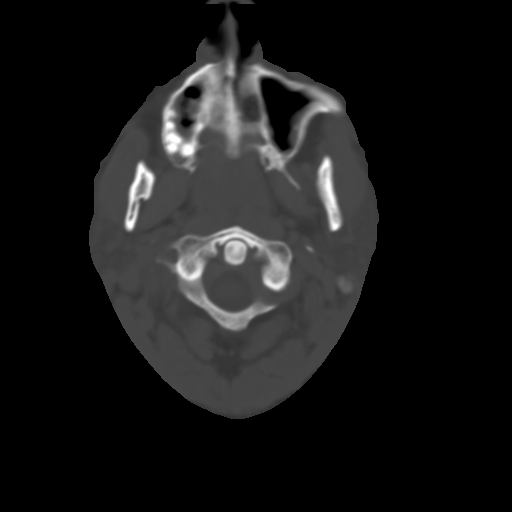
[im 7/39  brain]
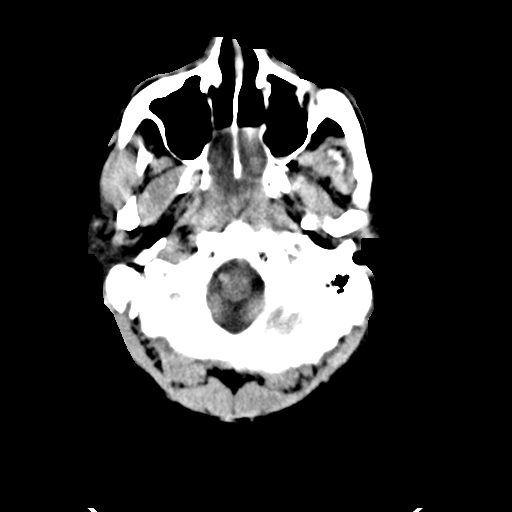
[im 11/39  brain]
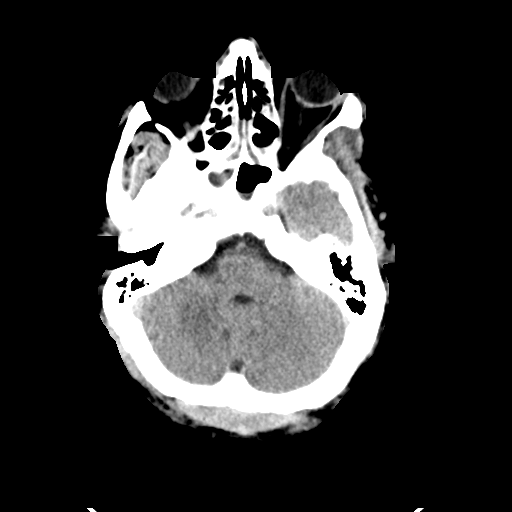
[im 14/39  brain]
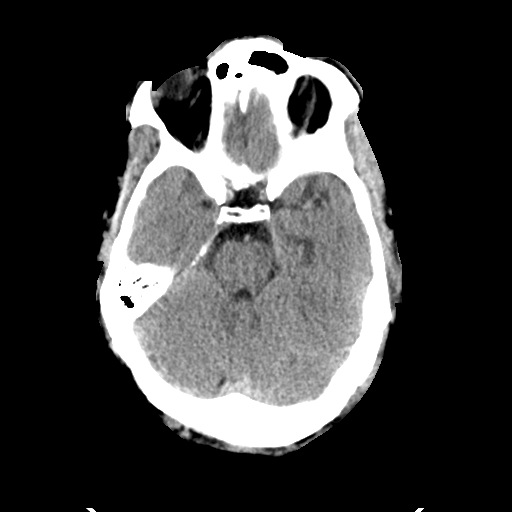
[im 18/39  brain]
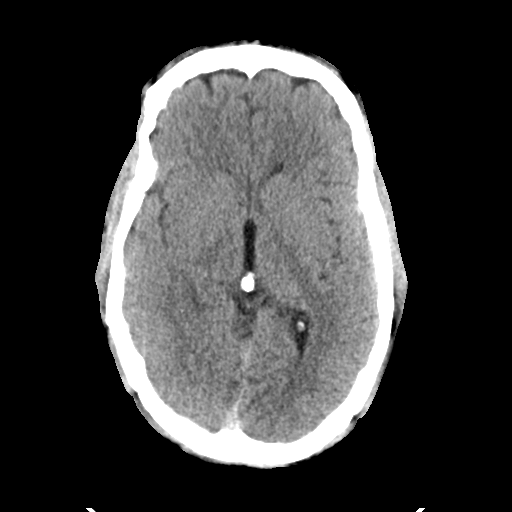
[im 18/39  bone]
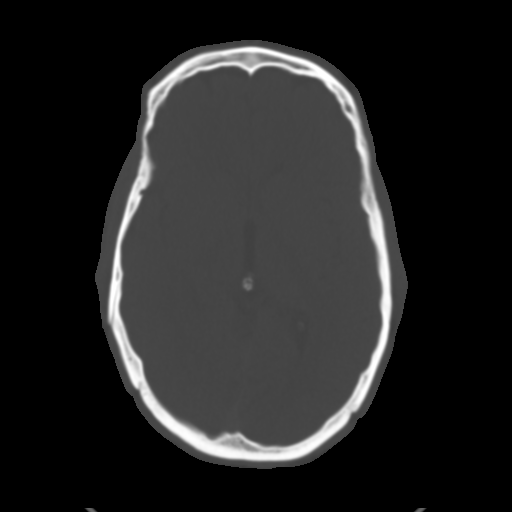
[im 21/39  brain]
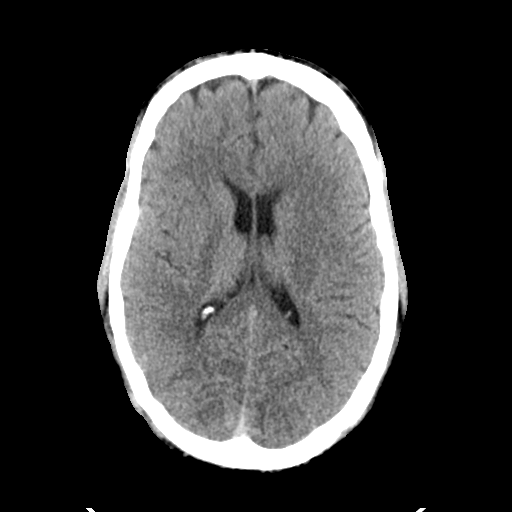
[im 25/39  brain]
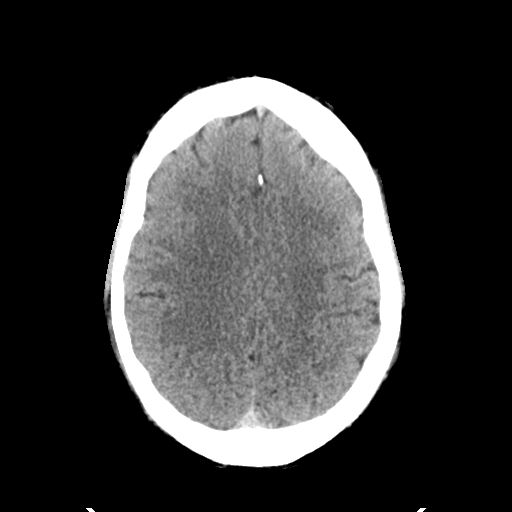
[im 29/39  brain]
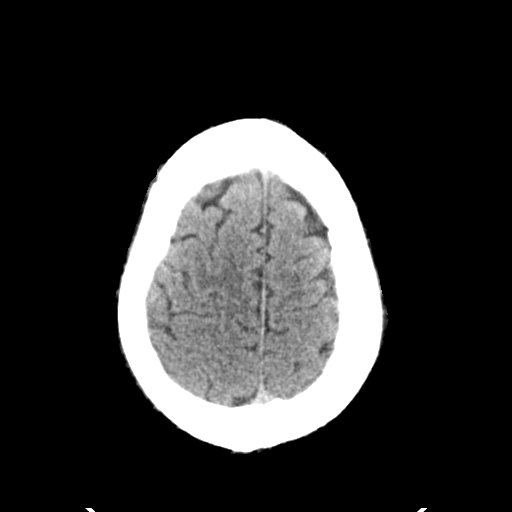
[im 32/39  brain]
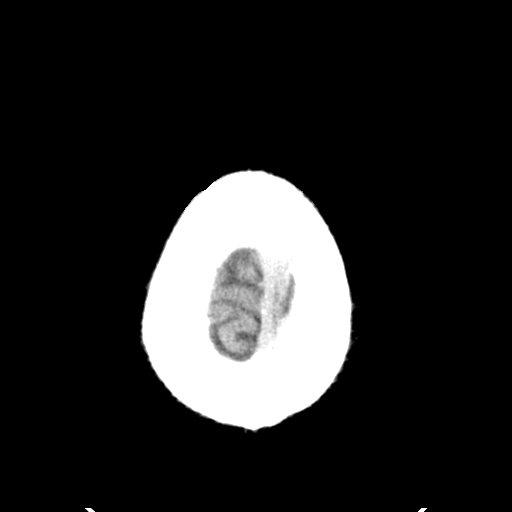
[im 32/39  bone]
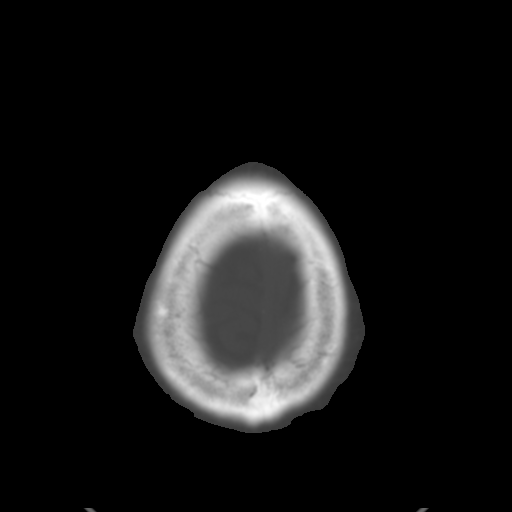
[im 36/39  brain]
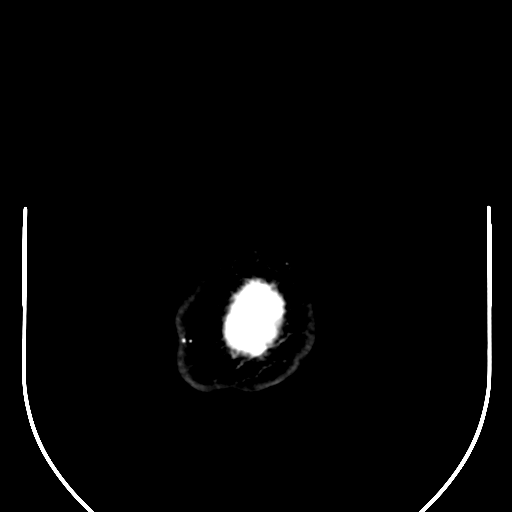

[Series 5: head 3.0 mpr cor · coronal · 0.35mm/px · 3 of 78 slices shown]
[im 26/78  brain]
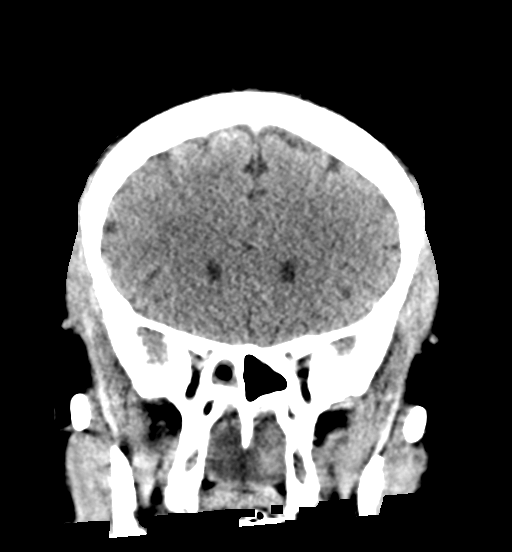
[im 35/78  brain]
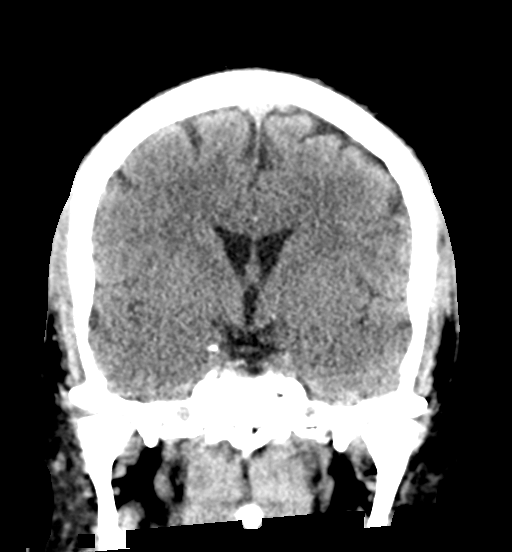
[im 43/78  brain]
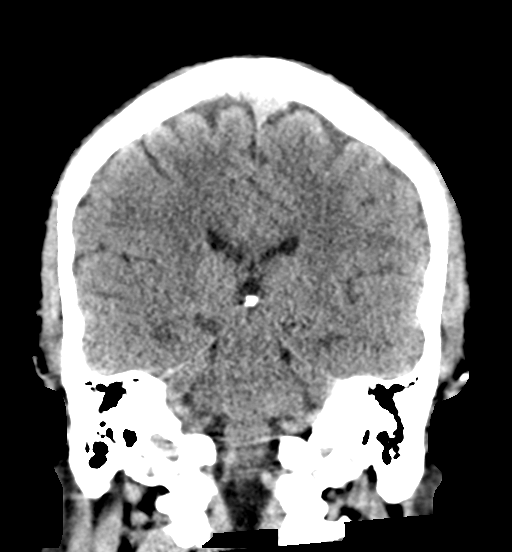

[Series 6: head 3.0 mpr sag · sagittal · 0.42mm/px · 3 of 67 slices shown]
[im 23/67  brain]
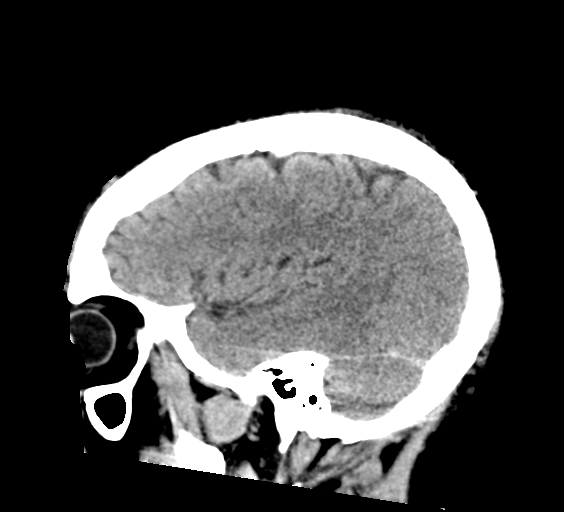
[im 34/67  brain]
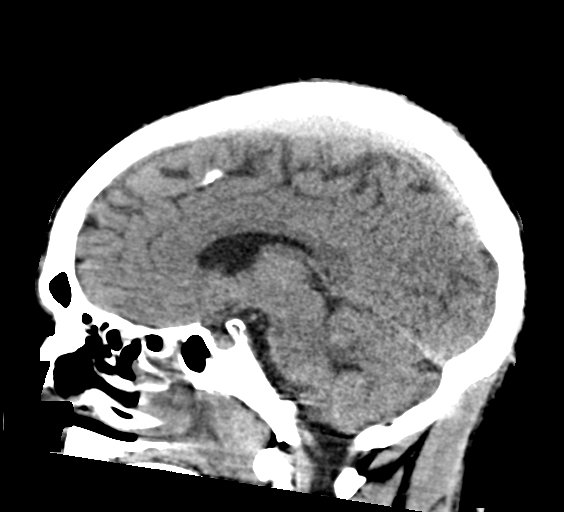
[im 45/67  brain]
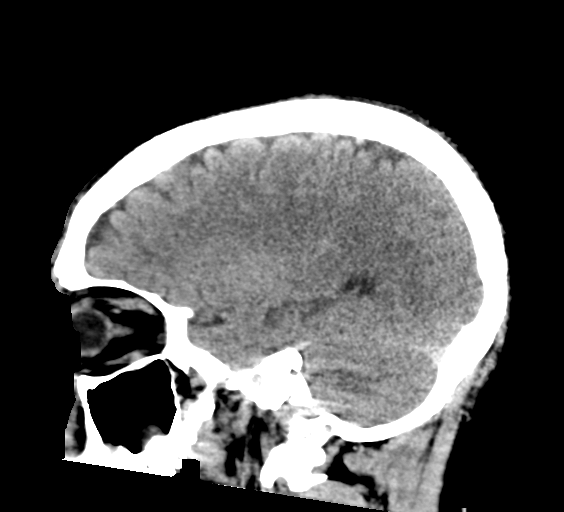

[16 of 47 positions shown; findings below may reference images not displayed]

FINDINGS: Brain: Abnormal infiltrative type low attenuation in the posterior
parietal/occipital lobes suspicious for PRES (posterior reversible
encephalopathy syndrome). Recommend MRI brain for further
evaluation.

The ventricles are in the midline without mass effect or shift. No
extra-axial fluid collections are identified. No CT findings for
hemispheric infarction or intracranial hemorrhage.

Vascular: No hyperdense vessels or aneurysm.

Skull: No skull fracture or bone lesions.

Sinuses/Orbits: Pansinusitis. The mastoid air cells and middle ear
cavities are clear. The globes are intact.

Other: No scalp lesions or scalp hematoma.
IMPRESSION: 1. Abnormal low attenuation in the posterior parietal/occipital
lobes suspicious for PRES (posterior reversible encephalopathy
syndrome). Recommend MRI brain for further evaluation.
2. No findings for infarction, hemorrhage or mass.
3. Pansinusitis.
